# Patient Record
Sex: Female | Born: 1970 | Race: White | Hispanic: No | Marital: Married | Smoking: Never smoker
Health system: Southern US, Community
[De-identification: ages and names within clinical notes are randomized; demographics above are authoritative.]

## PROBLEM LIST (undated history)

## (undated) DIAGNOSIS — D259 Leiomyoma of uterus, unspecified: Secondary | ICD-10-CM

## (undated) DIAGNOSIS — Z87820 Personal history of traumatic brain injury: Secondary | ICD-10-CM

## (undated) DIAGNOSIS — L309 Dermatitis, unspecified: Secondary | ICD-10-CM

## (undated) DIAGNOSIS — D649 Anemia, unspecified: Secondary | ICD-10-CM

## (undated) DIAGNOSIS — R569 Unspecified convulsions: Secondary | ICD-10-CM

## (undated) DIAGNOSIS — F329 Major depressive disorder, single episode, unspecified: Secondary | ICD-10-CM

## (undated) DIAGNOSIS — F32A Depression, unspecified: Secondary | ICD-10-CM

## (undated) HISTORY — DX: Dermatitis, unspecified: L30.9

## (undated) HISTORY — DX: Unspecified convulsions: R56.9

## (undated) HISTORY — DX: Personal history of traumatic brain injury: Z87.820

## (undated) HISTORY — DX: Leiomyoma of uterus, unspecified: D25.9

## (undated) HISTORY — DX: Anemia, unspecified: D64.9

---
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
## 1995-02-14 DIAGNOSIS — Z87820 Personal history of traumatic brain injury: Secondary | ICD-10-CM

## 1995-02-14 HISTORY — DX: Personal history of traumatic brain injury: Z87.820

## 2005-01-09 ENCOUNTER — Encounter: Admission: RE | Admit: 2005-01-09 | Discharge: 2005-01-09 | Payer: Self-pay | Admitting: Family Medicine

## 2005-05-26 ENCOUNTER — Encounter: Admission: RE | Admit: 2005-05-26 | Discharge: 2005-05-26 | Payer: Self-pay | Admitting: Family Medicine

## 2006-05-18 ENCOUNTER — Ambulatory Visit: Payer: Self-pay | Admitting: Family Medicine

## 2007-02-14 HISTORY — PX: UTERINE ARTERY EMBOLIZATION: SHX2629

## 2007-11-15 ENCOUNTER — Ambulatory Visit: Payer: Self-pay | Admitting: Occupational Medicine

## 2007-11-30 ENCOUNTER — Emergency Department: Payer: Self-pay | Admitting: Emergency Medicine

## 2007-12-18 ENCOUNTER — Encounter: Payer: Self-pay | Admitting: Cardiovascular Disease

## 2007-12-18 ENCOUNTER — Ambulatory Visit (HOSPITAL_COMMUNITY): Admission: RE | Admit: 2007-12-18 | Discharge: 2007-12-18 | Payer: Self-pay | Admitting: Cardiovascular Disease

## 2007-12-28 ENCOUNTER — Ambulatory Visit (HOSPITAL_BASED_OUTPATIENT_CLINIC_OR_DEPARTMENT_OTHER): Admission: RE | Admit: 2007-12-28 | Discharge: 2007-12-28 | Payer: Self-pay | Admitting: Internal Medicine

## 2008-01-11 ENCOUNTER — Ambulatory Visit: Payer: Self-pay | Admitting: Internal Medicine

## 2008-01-20 ENCOUNTER — Ambulatory Visit (HOSPITAL_BASED_OUTPATIENT_CLINIC_OR_DEPARTMENT_OTHER): Admission: RE | Admit: 2008-01-20 | Discharge: 2008-01-20 | Payer: Self-pay | Admitting: Internal Medicine

## 2008-01-21 ENCOUNTER — Ambulatory Visit (HOSPITAL_COMMUNITY): Admission: RE | Admit: 2008-01-21 | Discharge: 2008-01-21 | Payer: Self-pay | Admitting: Cardiovascular Disease

## 2008-01-22 ENCOUNTER — Ambulatory Visit (HOSPITAL_COMMUNITY): Admission: RE | Admit: 2008-01-22 | Discharge: 2008-01-22 | Payer: Self-pay | Admitting: Cardiovascular Disease

## 2008-01-25 ENCOUNTER — Ambulatory Visit: Payer: Self-pay | Admitting: Internal Medicine

## 2008-04-08 ENCOUNTER — Encounter: Admission: RE | Admit: 2008-04-08 | Discharge: 2008-04-08 | Payer: Self-pay | Admitting: Internal Medicine

## 2008-04-30 ENCOUNTER — Emergency Department (HOSPITAL_COMMUNITY): Admission: EM | Admit: 2008-04-30 | Discharge: 2008-04-30 | Payer: Self-pay | Admitting: Family Medicine

## 2008-05-25 ENCOUNTER — Ambulatory Visit (HOSPITAL_COMMUNITY): Admission: RE | Admit: 2008-05-25 | Discharge: 2008-05-25 | Payer: Self-pay | Admitting: Internal Medicine

## 2008-06-16 ENCOUNTER — Ambulatory Visit (HOSPITAL_COMMUNITY): Admission: RE | Admit: 2008-06-16 | Discharge: 2008-06-16 | Payer: Self-pay | Admitting: Internal Medicine

## 2008-07-07 ENCOUNTER — Encounter: Admission: RE | Admit: 2008-07-07 | Discharge: 2008-07-07 | Payer: Self-pay | Admitting: Obstetrics and Gynecology

## 2008-07-14 ENCOUNTER — Ambulatory Visit (HOSPITAL_COMMUNITY): Admission: RE | Admit: 2008-07-14 | Discharge: 2008-07-14 | Payer: Self-pay | Admitting: Interventional Radiology

## 2008-09-01 ENCOUNTER — Ambulatory Visit (HOSPITAL_COMMUNITY): Admission: RE | Admit: 2008-09-01 | Discharge: 2008-09-02 | Payer: Self-pay | Admitting: Interventional Radiology

## 2008-09-15 ENCOUNTER — Encounter: Admission: RE | Admit: 2008-09-15 | Discharge: 2008-09-15 | Payer: Self-pay | Admitting: Interventional Radiology

## 2009-04-20 ENCOUNTER — Encounter: Admission: RE | Admit: 2009-04-20 | Discharge: 2009-04-20 | Payer: Self-pay | Admitting: Interventional Radiology

## 2009-04-20 ENCOUNTER — Ambulatory Visit (HOSPITAL_COMMUNITY): Admission: RE | Admit: 2009-04-20 | Discharge: 2009-04-20 | Payer: Self-pay | Admitting: Interventional Radiology

## 2009-06-21 ENCOUNTER — Encounter: Admission: RE | Admit: 2009-06-21 | Discharge: 2009-06-21 | Payer: Self-pay | Admitting: Internal Medicine

## 2009-07-26 ENCOUNTER — Ambulatory Visit (HOSPITAL_COMMUNITY): Admission: RE | Admit: 2009-07-26 | Discharge: 2009-07-26 | Payer: Self-pay | Admitting: Interventional Radiology

## 2009-12-08 ENCOUNTER — Emergency Department (HOSPITAL_COMMUNITY): Admission: EM | Admit: 2009-12-08 | Discharge: 2009-12-08 | Payer: Self-pay | Admitting: Family Medicine

## 2010-01-16 ENCOUNTER — Emergency Department (HOSPITAL_COMMUNITY)
Admission: EM | Admit: 2010-01-16 | Discharge: 2010-01-16 | Payer: Self-pay | Source: Home / Self Care | Admitting: Emergency Medicine

## 2010-03-06 ENCOUNTER — Encounter: Payer: Self-pay | Admitting: Internal Medicine

## 2010-03-06 ENCOUNTER — Encounter: Payer: Self-pay | Admitting: Obstetrics and Gynecology

## 2010-03-06 ENCOUNTER — Encounter: Payer: Self-pay | Admitting: Cardiovascular Disease

## 2010-04-14 ENCOUNTER — Inpatient Hospital Stay: Payer: Self-pay | Admitting: Surgery

## 2010-05-22 LAB — CBC
HCT: 38.6 % (ref 36.0–46.0)
Hemoglobin: 13.4 g/dL (ref 12.0–15.0)
MCHC: 34.6 g/dL (ref 30.0–36.0)
MCV: 90.2 fL (ref 78.0–100.0)
Platelets: 261 10*3/uL (ref 150–400)
RBC: 4.28 MIL/uL (ref 3.87–5.11)
RDW: 12.8 % (ref 11.5–15.5)
WBC: 6.9 10*3/uL (ref 4.0–10.5)

## 2010-05-22 LAB — HCG, SERUM, QUALITATIVE: Preg, Serum: NEGATIVE

## 2010-05-22 LAB — CREATININE, SERUM
Creatinine, Ser: 0.59 mg/dL (ref 0.4–1.2)
GFR calc Af Amer: >60 mL/min (ref >60)
GFR calc non Af Amer: >60 mL/min (ref >60)

## 2010-06-28 NOTE — Procedures (Signed)
NAME:  Valerie Underwood, Valerie Underwood                   ACCOUNT NO.:  [id number]   MEDICAL RECORD NO.:  [id number]          PATIENT TYPE:  OUT   LOCATION:  SLEEP CENTER                 FACILITY:  [facility]   PHYSICIAN:  Clinton D. Maple Hudson, MD, FCCP, FACPDATE OF BIRTH:  08/05/1968   DATE OF STUDY:  01/20/2008                            NOCTURNAL POLYSOMNOGRAM   REFERRING PHYSICIAN:  Beverely Risen   REFERRING PHYSICIAN:  Beverely Risen, MD   INDICATION FOR STUDY:  Hypersomnia with sleep apnea.   EPWORTH SLEEPINESS SCORE:  10/24.  BMI 44.  Weight 280 pounds.  Height  67 inches.  Neck 16.5 inches.   HOME MEDICATIONS:  Charted and reviewed.  A baseline diagnostic NPSG on  December 28, 2007, recorded an AHI of 5.1 per hour.  CPAP titration is  requested.   SLEEP ARCHITECTURE:  Total sleep time 270 minutes with sleep efficiency  76.1%.  Stage I was 2.2%.  Stage II 67.7%.  Stage III 3.9%.  REM 26.2%  of total sleep time.  Sleep latency 42 minutes.  REM latency 80.5  minutes.  Awake after sleep onset 42.5 minutes.  Arousal index 19.3.  No  bedtime medication was taken.   RESPIRATORY DATA:  CPAP titration protocol.  CPAP was titrated to 10  CWP, AHI 1.2 per hour.  She chose a small ResMed Mirage Quattro mask  with heated humidifier.  She made the comment that she would need a more  comfortable mask for longterm use.   OXYGEN DATA:  Snoring was completely prevented by CPAP with mean oxygen  saturation on CPAP holding 94.7% on room air.   CARDIAC DATA:  Normal sinus rhythm.   MOVEMENT/PARASOMNIA:  No significant limb movement disturbance.  Bathroom x1.   IMPRESSION/RECOMMENDATION:  1. Successful CPAP titration to 10 centimeters of water pressure,      apnea-hypopnea index 1.2 per hour.  She chose a small ResMed Mirage      Quattro full face mask with heated humidifier.  2. Baseline diagnostic nocturnal polysomnogram on December 28, 2007,      had recorded apnea-hypopnea index of 5.1 per hour.      Clinton D.  Maple Hudson, MD, [facility], FACP  Diplomate, Biomedical engineer of Sleep Medicine  Electronically Signed     CDY/MEDQ  D:  01/25/2008 13:55:10  T:  01/25/2008 20:21:06  Job:  [id number]

## 2010-06-28 NOTE — Procedures (Signed)
NAME:  Valerie Underwood, Valerie Underwood                   ACCOUNT NO.:  [id number]   MEDICAL RECORD NO.:  [id number]          PATIENT TYPE:  OUT   LOCATION:  SLEEP CENTER                 FACILITY:  [facility]   PHYSICIAN:  Clinton D. Maple Hudson, MD, FCCP, FACPDATE OF BIRTH:  05[birth year]07-68   DATE OF STUDY:  12/28/2007                            NOCTURNAL POLYSOMNOGRAM   REFERRING PHYSICIAN:   INDICATION FOR STUDY:  Hypersomnia with sleep apnea.   EPWORTH SLEEPINESS SCORE:  10/24.   BMI 46.3.  Weight 287 pounds.  Height 66 inches.  Neck 16.5 inches.   MEDICATIONS:  Home medications are charted and reviewed.   SLEEP ARCHITECTURE:  Total sleep time 327 minutes with sleep efficiency  74.7%.  Stage I was 4.7%.  Stage II 78.8%.  Stage III 1.5%.  REM 15% of  total sleep time.  Sleep latency 24 minutes.  REM latency 122 minutes.  Wake after sleep onset 8 minutes.  Arousal index 13.2.  No bedtime  medication was taken.   RESPIRATORY DATA:  Apnea-hypopnea index (AHI) 5.1 per hour.  Respiratory  disturbance index (RDI) 6.4 per hour with RERA count 7, index 1.3.  A  total of 28 events were scored, all hypopneas.  Events were more common  in supine as expected.  REM AHI 19.6.   OXYGEN DATA:  Moderate-to-loud snoring with oxygen desaturation to a  nadir of 86%.  Mean oxygen saturation through the study was 93.7% on  room air.   CARDIAC DATA:  Normal sinus rhythm.   MOVEMENT-PARASOMNIA:  No significant movement disturbance.  No bathroom  trips.   IMPRESSIONS-RECOMMENDATIONS:  1. Minimal obstructive sleep apnea/hypopnea syndrome, AHI 5.1 per hour      (normal range of 0-5 per hour).  All events were hypopneas,      predominantly while sleeping supine.  Moderate-to-loud snoring with      oxygen desaturation to a nadir of 86%.  2. Scores from this range are not usually addressed with CPAP.  Weight      loss and sleeping position of flatter back may be encouraged if      appropriate.      Clinton D. Maple Hudson, MD, FCCP,  FACP  Diplomate, Biomedical engineer of Sleep Medicine  Electronically Signed     CDY/MEDQ  D:  01/04/2008 13:50:04  T:  01/05/2008 00:57:07  Job:  [id number]

## 2010-07-05 ENCOUNTER — Other Ambulatory Visit: Payer: Self-pay | Admitting: Internal Medicine

## 2010-07-05 DIAGNOSIS — Z1231 Encounter for screening mammogram for malignant neoplasm of breast: Secondary | ICD-10-CM

## 2010-07-13 ENCOUNTER — Ambulatory Visit: Payer: Self-pay | Admitting: Internal Medicine

## 2010-07-13 ENCOUNTER — Ambulatory Visit: Payer: Self-pay

## 2010-08-05 ENCOUNTER — Ambulatory Visit
Admission: RE | Admit: 2010-08-05 | Discharge: 2010-08-05 | Disposition: A | Discharge status: Home / Self Care | Payer: Commercial Managed Care - PPO | Source: Ambulatory Visit | Attending: Internal Medicine | Admitting: Internal Medicine

## 2010-08-05 DIAGNOSIS — Z1231 Encounter for screening mammogram for malignant neoplasm of breast: Secondary | ICD-10-CM

## 2010-11-08 ENCOUNTER — Ambulatory Visit: Payer: Commercial Managed Care - PPO | Admitting: Family Medicine

## 2010-11-11 ENCOUNTER — Encounter: Payer: Self-pay | Admitting: Family Medicine

## 2010-11-11 ENCOUNTER — Ambulatory Visit (INDEPENDENT_AMBULATORY_CARE_PROVIDER_SITE_OTHER): Payer: Commercial Managed Care - PPO | Admitting: Family Medicine

## 2010-11-11 VITALS — BP 134/93 | HR 69 | Temp 98.4°F | Ht 68.0 in | Wt 275.0 lb

## 2010-11-11 DIAGNOSIS — M79672 Pain in left foot: Secondary | ICD-10-CM | POA: Insufficient documentation

## 2010-11-11 DIAGNOSIS — M79609 Pain in unspecified limb: Secondary | ICD-10-CM

## 2010-11-11 NOTE — Assessment & Plan Note (Signed)
2/2 metatarsalgia and ? Mortons neuroma in past.  X-rays show no evidence of DJD at MTP either.  While she has had metatarsal pads before, when I placed these she noted they were not in the same location and did not feel as comfortable as where I had placed them.  Advised her to try these with sports insoles.  Icing, tylenol/motrin as needed for pain, relative rest.  If does well with these, advised her to order from [employer] in bulk.  Custom orthotics from [place] are also an option.

## 2010-11-11 NOTE — Progress Notes (Signed)
  Subjective:    Patient ID: Valerie Underwood, female    DOB: 12/15/1970, 40 y.o.   MRN: [id number]  PCP: Dr. Welton Flakes  HPI 40 yo F here for left foot pain.  Patient reports having problems with feet for approximately 20 years.  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 Has had custom orthotics made which help mildly. Has had several cortisone injections between 3rd and 4th MTs which help initially but always wear off. Podiatrist earlier this year did an aspiration and injection of 4th MTP that helped mildly as well. Most recent visit he recommended patient have surgical debridement and discussed possible fusion. Patient would like to continue with conservative care if possible. Has tried metatarsal pads but they did not feel comfortable.  History reviewed. No pertinent past medical history.  No current outpatient prescriptions on file prior to visit.    History reviewed. No pertinent past surgical history.  No Known Allergies  History   Social History  . Marital Status: Single    Spouse Name: N/A    Number of Children: N/A  . Years of Education: N/A   Occupational History  . Not on file.   Social History Main Topics  . Smoking status: Never Smoker   . Smokeless tobacco: Not on file  . Alcohol Use: Not on file  . Drug Use: Not on file  . Sexually Active: Not on file   Other Topics Concern  . Not on file   Social History Narrative  . No narrative on file    Family History  Problem Relation Age of Onset  . Adopted: Yes    BP 134/93  Pulse 69  Temp(Src) 98.4 F (36.9 C) (Oral)  Ht 5\' 8"  (1.727 m)  Wt 275 lb (124.739 kg)  BMI 41.81 kg/m2  Review of Systems See HPI above.    Objective:   Physical Exam Gen: NAD L foot: Mod collapse of transverse arch but no callus formation.  L worse than R foot. TTP 4th MT head, less tenderness 3rd MT head and between 3rd and 4th MT heads. No palpable neuroma on exam. FROM ankle, digits  without limitation at MTP joints. NVI distally    Assessment & Plan:  1. Left foot pain - 2/2 metatarsalgia and ? Mortons neuroma in past.  X-rays show no evidence of DJD at MTP either.  While she has had metatarsal pads before, when I placed these she noted they were not in the same location and did not feel as comfortable as where I had placed them.  Advised her to try these with sports insoles.  Icing, tylenol/motrin as needed for pain, relative rest.  If does well with these, advised her to order from [employer] in bulk.  Custom orthotics from [place] are also an option.

## 2010-11-11 NOTE — Patient Instructions (Signed)
Conservative treatment for this long term includes orthotics + metatarsal pads. You can ice, take anti-inflammatories too but these are only temporary measures. There are surgical procedures to remove the nerve and neuroma but most people do not need these.

## 2011-01-17 ENCOUNTER — Encounter: Payer: Self-pay | Admitting: Internal Medicine

## 2011-01-17 ENCOUNTER — Ambulatory Visit (INDEPENDENT_AMBULATORY_CARE_PROVIDER_SITE_OTHER): Payer: 59 | Admitting: Internal Medicine

## 2011-01-17 VITALS — BP 128/86 | HR 88 | Temp 98.2°F | Ht 66.5 in | Wt 284.0 lb

## 2011-01-17 DIAGNOSIS — F329 Major depressive disorder, single episode, unspecified: Secondary | ICD-10-CM

## 2011-01-17 DIAGNOSIS — R569 Unspecified convulsions: Secondary | ICD-10-CM | POA: Insufficient documentation

## 2011-01-17 DIAGNOSIS — D259 Leiomyoma of uterus, unspecified: Secondary | ICD-10-CM | POA: Insufficient documentation

## 2011-01-17 DIAGNOSIS — Z780 Asymptomatic menopausal state: Secondary | ICD-10-CM

## 2011-01-17 DIAGNOSIS — Z87820 Personal history of traumatic brain injury: Secondary | ICD-10-CM | POA: Insufficient documentation

## 2011-01-17 DIAGNOSIS — N951 Menopausal and female climacteric states: Secondary | ICD-10-CM

## 2011-01-17 DIAGNOSIS — D649 Anemia, unspecified: Secondary | ICD-10-CM | POA: Insufficient documentation

## 2011-01-17 DIAGNOSIS — F32A Depression, unspecified: Secondary | ICD-10-CM | POA: Insufficient documentation

## 2011-01-17 MED ORDER — CLONIDINE HCL 0.1 MG PO TABS: ORAL_TABLET | ORAL | Status: AC

## 2011-01-17 NOTE — Patient Instructions (Addendum)
OK to take clonidine o.1 mg at night.  Get up slowly from sleep for the first week  See me for CPE  Labs will be mailed to you

## 2011-01-17 NOTE — Progress Notes (Signed)
Addended by: Raechel Chute D on: 01/17/2011 10:35 AM   Modules accepted: Orders

## 2011-01-17 NOTE — Progress Notes (Signed)
Subjective:    Patient ID: Valerie Underwood, female    DOB: 09/15/1970, 40 y.o.   MRN: [id number]  HPI  New pt here for first visit.  Former pt of Dr. Liz Beach. And [facility].  Had seen remotely a  Neurologist in [facility] for traumatic brain injury and seizure.  PMH of menorrhagia with anemia S?P [place],  Depression since 1989,  TBI with resultant seizures but has been off anti seizure meds since 2002.    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  Having insomnia and mood irritability.  She denies worsening depression and Zoloft has kept her well controlled.  She still is having monthly cycles.  She is not aware of FH as she was in foster care since the age of 1.  NO personal history of DVT, PE breast or GYN cancers.    No Known Allergies Past Medical History  Diagnosis Date  . Uterine fibroid     history  . History of traumatic brain injury 1997  . Anemia   . Seizures     hx- after TBI, last sz 2001   Past Surgical History  Procedure Date  . Uterine artery embolization 2009  . Dilation and curettage of uterus 1988   History   Social History  . Marital Status: Single    Spouse Name: N/A    Number of Children: N/A  . Years of Education: N/A   Occupational History  . Not on file.   Social History Main Topics  . Smoking status: Never Smoker   . Smokeless tobacco: Never Used  . Alcohol Use: 2.4 oz/week    4 Glasses of wine per week  . Drug Use: No  . Sexually Active: Yes    Birth Control/ Protection: None   Other Topics Concern  . Not on file   Social History Narrative  . No narrative on file   Family History  Problem Relation Age of Onset  . Adopted: Yes   Patient Active Problem List  Diagnoses  . Left foot pain  . Anemia  . Uterine fibroid  . History of traumatic brain injury  . Seizures  . Morbid obesity  . Depression   No current outpatient prescriptions on file prior to  visit.           Review of Systems See HPI    Objective:   Physical Exam  Physical Exam  Nursing note and vitals reviewed.  Constitutional: She is oriented to person, place, and time. She appears well-developed and well-nourished.  HENT:  Head: Normocephalic and atraumatic.  Cardiovascular: Normal rate and regular rhythm. Exam reveals no gallop and no friction rub.  No murmur heard.  Pulmonary/Chest: Breath sounds normal. She has no wheezes. She has no rales.  Neurological: She is alert and oriented to person, place, and time.  Skin: Skin is warm and dry.  Psychiatric: She has a normal mood and affect. Her behavior is normal.        Assessment & Plan:  1)  Menopausal hot flushes, insomnia  Will check TSH FSH and LH  .  30 min counseling of HT and risk benefit.  Pt given informational sheet.   She wishes to try Clonidine at nigth 0.1 mg  Counsleed to arise slowly when getting up in morning for first week  2)  Depression  Well controlled on Zoloft 3)  MOrbid obesity  4)  TBI  Remote seizures off meds now 5)  Anemia  Will check CBC

## 2011-01-18 ENCOUNTER — Encounter: Payer: Self-pay | Admitting: Internal Medicine

## 2011-01-18 ENCOUNTER — Encounter: Payer: Self-pay | Admitting: Emergency Medicine

## 2011-01-18 DIAGNOSIS — E781 Pure hyperglyceridemia: Secondary | ICD-10-CM | POA: Insufficient documentation

## 2011-01-18 LAB — COMPREHENSIVE METABOLIC PANEL
AST: 19 U/L (ref 0–37)
Albumin: 4.7 g/dL (ref 3.5–5.2)
Alkaline Phosphatase: 53 U/L (ref 39–117)
BUN: 19 mg/dL (ref 6–23)
Creat: 0.67 mg/dL (ref 0.50–1.10)
Potassium: 4.4 mEq/L (ref 3.5–5.3)
Total Bilirubin: 0.4 mg/dL (ref 0.3–1.2)

## 2011-01-18 LAB — LIPID PANEL
Cholesterol: 212 mg/dL — ABNORMAL HIGH (ref 0–200)
HDL: 40 mg/dL (ref >39)
Total CHOL/HDL Ratio: 5.3 Ratio
VLDL: 73 mg/dL — ABNORMAL HIGH (ref 0–40)

## 2011-02-15 ENCOUNTER — Encounter: Payer: 59 | Admitting: Internal Medicine

## 2011-05-23 ENCOUNTER — Ambulatory Visit (INDEPENDENT_AMBULATORY_CARE_PROVIDER_SITE_OTHER): Payer: 59 | Admitting: Family Medicine

## 2011-05-23 ENCOUNTER — Ambulatory Visit (INDEPENDENT_AMBULATORY_CARE_PROVIDER_SITE_OTHER): Payer: 59 | Admitting: Internal Medicine

## 2011-05-23 ENCOUNTER — Encounter: Payer: Self-pay | Admitting: Family Medicine

## 2011-05-23 ENCOUNTER — Encounter: Payer: Self-pay | Admitting: Internal Medicine

## 2011-05-23 VITALS — BP 123/86 | HR 85 | Temp 98.3°F | Ht 67.0 in | Wt 250.0 lb

## 2011-05-23 VITALS — BP 128/80 | HR 64 | Temp 98.6°F | Resp 12 | Ht 66.5 in | Wt 269.1 lb

## 2011-05-23 DIAGNOSIS — Z124 Encounter for screening for malignant neoplasm of cervix: Secondary | ICD-10-CM

## 2011-05-23 DIAGNOSIS — M7750 Other enthesopathy of unspecified foot: Secondary | ICD-10-CM

## 2011-05-23 DIAGNOSIS — Z780 Asymptomatic menopausal state: Secondary | ICD-10-CM

## 2011-05-23 DIAGNOSIS — Z1151 Encounter for screening for human papillomavirus (HPV): Secondary | ICD-10-CM

## 2011-05-23 DIAGNOSIS — M79672 Pain in left foot: Secondary | ICD-10-CM

## 2011-05-23 DIAGNOSIS — N951 Menopausal and female climacteric states: Secondary | ICD-10-CM

## 2011-05-23 DIAGNOSIS — R269 Unspecified abnormalities of gait and mobility: Secondary | ICD-10-CM

## 2011-05-23 DIAGNOSIS — Z01419 Encounter for gynecological examination (general) (routine) without abnormal findings: Secondary | ICD-10-CM

## 2011-05-23 DIAGNOSIS — M7740 Metatarsalgia, unspecified foot: Secondary | ICD-10-CM

## 2011-05-23 DIAGNOSIS — M79609 Pain in unspecified limb: Secondary | ICD-10-CM

## 2011-05-23 LAB — POCT URINALYSIS DIPSTICK
Bilirubin, UA: NEGATIVE
Blood, UA: NEGATIVE
Glucose, UA: NEGATIVE
Ketones, UA: NEGATIVE
Spec Grav, UA: <=1.005

## 2011-05-23 NOTE — Progress Notes (Signed)
Subjective:    Patient ID: Valerie Underwood, female    DOB: [birth year], 41 y.o.   MRN: [id number]  PCP: Dr. Welton Flakes  HPI  41 yo F here for f/u left foot pain.  11/11/10: Patient reports having problems with feet for approximately 20 years.  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 Has had custom orthotics made which help mildly. Has had several cortisone injections between 3rd and 4th MTs which help initially but always wear off. Podiatrist earlier this year did an aspiration and injection of 4th MTP that helped mildly as well. Most recent visit he recommended patient have surgical debridement and discussed possible fusion. Patient would like to continue with conservative care if possible. Has tried metatarsal pads but they did not feel comfortable.  05/23/11: Patient reports she did well with sports insoles and MT pads. Feels the sports insoles are now worn out and MT pads were sliding on her. Has a few months excellent relief with this though. Would like to go ahead with custom orthotics.  Past Medical History  Diagnosis Date  . Uterine fibroid     history  . History of traumatic brain injury 1997  . Anemia   . Seizures     hx- after TBI, last sz 2001    Current Outpatient Prescriptions on File Prior to Visit  Medication Sig Dispense Refill  . Multiple Vitamin (MULTIVITAMIN) tablet Take 1 tablet by mouth daily.        . sertraline (ZOLOFT) 100 MG tablet Take 100 mg by mouth daily.          Past Surgical History  Procedure Date  . Uterine artery embolization 2009  . Dilation and curettage of uterus 1988    No Known Allergies  History   Social History  . Marital Status: Single    Spouse Name: N/A    Number of Children: N/A  . Years of Education: N/A   Occupational History  . Not on file.   Social History Main Topics  . Smoking status: Never Smoker   . Smokeless tobacco: Never Used  . Alcohol Use: 2.4 oz/week    4 Glasses  of wine per week  . Drug Use: No  . Sexually Active: Yes    Birth Control/ Protection: None   Other Topics Concern  . Not on file   Social History Narrative  . No narrative on file    Family History  Problem Relation Age of Onset  . Adopted: Yes    BP 123/86  Pulse 85  Temp(Src) 98.3 F (36.8 C) (Oral)  Ht 5\' 7"  (1.702 m)  Wt 250 lb (113.399 kg)  BMI 39.16 kg/m2  LMP 05/16/2011  Review of Systems  See HPI above.    Objective:   Physical Exam  Gen: NAD L foot: Mod collapse of transverse arch but no callus formation.  L worse than R foot. No hallux valgus or rigidus. Mild TTP 3rd and 4th MT heads. No palpable neuroma on exam. FROM ankle, digits without limitation at MTP joints. NVI distally  R foot: Mod transverse arch collapse, no callus.  No hallux valgus or rigidus. No TTP.    Assessment & Plan:  1. Left foot pain - 2/2 metatarsalgia and ? Mortons neuroma in past.  Excellent results with sports insoles and metatarsal pads.  Will move forward with custom orthotics today.  Advised to first try these without the MT pads for 1-2 weeks - if  not improving advised to come back and will just add the MT pads.    Patient was fitted for a : standard, cushioned, semi-rigid orthotic. The orthotic was heated and afterward the patient stood on the orthotic blank positioned on the orthotic stand. The patient was positioned in subtalar neutral position and 10 degrees of ankle dorsiflexion in a weight bearing stance. After completion of molding, a stable base was applied to the orthotic blank. The blank was ground to a stable position for weight bearing. Size: 10 dark blue swirl Base: blue med density eva Posting: 1st ray posts bilaterally for stability Additional orthotic padding: none Total prep time: 40 minutes

## 2011-05-23 NOTE — Progress Notes (Signed)
Subjective:    Patient ID: Valerie Underwood, female    DOB: Oct 18, 1970, 41 y.o.   MRN: [id number]  HPI Valerie Underwood is here for comprehensive eval.  Overall doing well  .  She did not start Clonidine as her sweats are only 1-2 times per month in the peri-menstrual time frame.  Of note she has been on Zoloft since the 1980's.   She reports one episode of vaginal spotting just this month.  She had vaginal spotting yesterday.  She is S/P [place] for enlarged fibroids and menses have been regular.   She reports she has a long history of enlarged uterus with very large fibroids   I reviewed labs with her.    No seizure activity.  No Known Allergies Past Medical History  Diagnosis Date  . Uterine fibroid     history  . History of traumatic brain injury 1997  . Anemia   . Seizures     hx- after TBI, last sz 2001   Past Surgical History  Procedure Date  . Uterine artery embolization 2009  . Dilation and curettage of uterus 1988   History   Social History  . Marital Status: Single    Spouse Name: N/A    Number of Children: N/A  . Years of Education: N/A   Occupational History  . Not on file.   Social History Main Topics  . Smoking status: Never Smoker   . Smokeless tobacco: Never Used  . Alcohol Use: 2.4 oz/week    4 Glasses of wine per week  . Drug Use: No  . Sexually Active: Yes    Birth Control/ Protection: None   Other Topics Concern  . Not on file   Social History Narrative  . No narrative on file   Family History  Problem Relation Age of Onset  . Adopted: Yes   Patient Active Problem List  Diagnoses  . Left foot pain  . Anemia  . Uterine fibroid  . History of traumatic brain injury  . Seizures  . Morbid obesity  . Depression  . Hypertriglyceridemia   Current Outpatient Prescriptions on File Prior to Visit  Medication Sig Dispense Refill  . Multiple Vitamin (MULTIVITAMIN) tablet Take 1 tablet by mouth daily.        . sertraline (ZOLOFT) 100 MG tablet Take 100 mg by  mouth daily.             Review of Systems  Constitutional: Negative.   HENT: Negative.   Eyes: Negative.   Respiratory: Negative.   Cardiovascular: Negative.   Gastrointestinal: Negative.   Genitourinary: Negative.   Musculoskeletal: Negative.   Skin: Negative.   Neurological: Negative.   Hematological: Negative.        Objective:   Physical Exam  Physical Exam  Vital signs and nursing note reviewed  Constitutional: She is oriented to person, place, and time. She appears well-developed and well-nourished. She is cooperative.  HENT:  Head: Normocephalic and atraumatic.  Right Ear: Tympanic membrane normal.  Left Ear: Tympanic membrane normal.  Nose: Nose normal.  Mouth/Throat: Oropharynx is clear and moist and mucous membranes are normal. No oropharyngeal exudate or posterior oropharyngeal erythema.  Eyes: Conjunctivae and EOM are normal. Pupils are equal, round, and reactive to light.  Neck: Neck supple. No JVD present. Carotid bruit is not present. No mass and no thyromegaly present.  Cardiovascular: Regular rhythm, normal heart sounds, intact distal pulses and normal pulses.  Exam reveals no gallop and no friction rub.  No murmur heard. Pulses:      Dorsalis pedis pulses are 2+ on the right side, and 2+ on the left side.  Pulmonary/Chest: Breath sounds normal. She has no wheezes. She has no rhonchi. She has no rales. Right breast exhibits no mass, no nipple discharge and no skin change. Left breast exhibits no mass, no nipple discharge and no skin change.  Abdominal: Soft. Bowel sounds are normal. She exhibits no distension and no mass. There is no hepatosplenomegaly. There is no tenderness. There is no CVA tenderness.  Genitourinary: Rectum normal, vagina normal. Uterus is enlarged midway to umbilicus. Rectal exam shows no mass. Guaiac negative stool. No labial fusion. There is no lesion on the right labia. There is no lesion on the left labia. Cervix exhibits no motion  tenderness. Right adnexum displays no mass, no tenderness and no fullness. Left adnexum displays no mass, no tenderness and no fullness. No erythema around the vagina.  Musculoskeletal:       No active synovitis to any joint.    Lymphadenopathy:       Right cervical: No superficial cervical adenopathy present.      Left cervical: No superficial cervical adenopathy present.       Right axillary: No pectoral and no lateral adenopathy present.       Left axillary: No pectoral and no lateral adenopathy present.      Right: No inguinal adenopathy present.       Left: No inguinal adenopathy present.  Neurological: She is alert and oriented to person, place, and time. She has normal strength and normal reflexes. No cranial nerve deficit or sensory deficit. She displays a negative Romberg sign. Coordination and gait normal.  Skin: Skin is warm and dry. No abrasion, no bruising, no ecchymosis and no rash noted. No cyanosis. Nails show no clubbing. Few nevi on back and nevus on R breast near nipple.  Psychiatric: She has a normal mood and affect. Her speech is normal and behavior is normal.       Assessment & Plan:   1)  Health maintenance. See scanned HM sheet.  Check CBC and Vit D today.  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 2)  Vaginal spotting S/P [place]? Uterine fibroid.  I counseled pt if spotting continues longer that 3 months she is to see me in office and will need TVUS.  She voices understanding.  She does have history of large fibroids. 3)  Anemia by history  Will check today 4)  History of sezure and TBI no seizure activity 5)  Obesity:  Dash diet given  6)  Dpression Well controlled on Zoloft 7)  Hypertriglyceridemia  Mild Dash Diet     Assessment & Plan:

## 2011-05-23 NOTE — Patient Instructions (Signed)
Call office for appointment  if vaginal spotting continues longer than 3 months  Labs will be mailed to you

## 2011-05-24 ENCOUNTER — Encounter: Payer: Self-pay | Admitting: Emergency Medicine

## 2011-05-24 DIAGNOSIS — R269 Unspecified abnormalities of gait and mobility: Secondary | ICD-10-CM | POA: Insufficient documentation

## 2011-05-24 DIAGNOSIS — M7740 Metatarsalgia, unspecified foot: Secondary | ICD-10-CM | POA: Insufficient documentation

## 2011-05-24 LAB — CBC WITH DIFFERENTIAL/PLATELET
Eosinophils Absolute: 0.1 10*3/uL (ref 0.0–0.7)
Eosinophils Relative: 1 % (ref 0–5)
HCT: 41.8 % (ref 36.0–46.0)
Hemoglobin: 13.6 g/dL (ref 12.0–15.0)
Lymphocytes Relative: 31 % (ref 12–46)
Lymphs Abs: 2.3 10*3/uL (ref 0.7–4.0)
MCH: 29.4 pg (ref 26.0–34.0)
MCV: 90.3 fL (ref 78.0–100.0)
Monocytes Relative: 6 % (ref 3–12)
RBC: 4.63 MIL/uL (ref 3.87–5.11)
WBC: 7.3 10*3/uL (ref 4.0–10.5)

## 2011-05-24 NOTE — Assessment & Plan Note (Signed)
2/2 metatarsalgia and ? Mortons neuroma in past.  Excellent results with sports insoles and metatarsal pads.  Will move forward with custom orthotics today.  Advised to first try these without the MT pads for 1-2 weeks - if not improving advised to come back and will just add the MT pads.    Patient was fitted for a : standard, cushioned, semi-rigid orthotic. The orthotic was heated and afterward the patient stood on the orthotic blank positioned on the orthotic stand. The patient was positioned in subtalar neutral position and 10 degrees of ankle dorsiflexion in a weight bearing stance. After completion of molding, a stable base was applied to the orthotic blank. The blank was ground to a stable position for weight bearing. Size: 10 dark blue swirl Base: blue med density eva Posting: 1st ray posts bilaterally for stability Additional orthotic padding: none Total prep time: 40 minutes

## 2011-05-29 ENCOUNTER — Encounter: Payer: Self-pay | Admitting: *Deleted

## 2011-07-26 ENCOUNTER — Other Ambulatory Visit (HOSPITAL_BASED_OUTPATIENT_CLINIC_OR_DEPARTMENT_OTHER): Payer: Self-pay | Admitting: Internal Medicine

## 2011-07-26 DIAGNOSIS — Z139 Encounter for screening, unspecified: Secondary | ICD-10-CM

## 2011-08-09 ENCOUNTER — Ambulatory Visit (HOSPITAL_BASED_OUTPATIENT_CLINIC_OR_DEPARTMENT_OTHER)
Admission: RE | Admit: 2011-08-09 | Discharge: 2011-08-09 | Disposition: A | Discharge status: Home / Self Care | Payer: 59 | Source: Ambulatory Visit | Attending: Internal Medicine | Admitting: Internal Medicine

## 2011-08-09 DIAGNOSIS — Z1231 Encounter for screening mammogram for malignant neoplasm of breast: Secondary | ICD-10-CM | POA: Insufficient documentation

## 2011-08-09 DIAGNOSIS — Z139 Encounter for screening, unspecified: Secondary | ICD-10-CM

## 2012-01-30 ENCOUNTER — Ambulatory Visit: Payer: Self-pay

## 2012-01-30 ENCOUNTER — Other Ambulatory Visit: Payer: Self-pay | Admitting: Occupational Medicine

## 2012-01-30 DIAGNOSIS — M25569 Pain in unspecified knee: Secondary | ICD-10-CM

## 2012-02-19 ENCOUNTER — Encounter: Payer: Self-pay | Admitting: Emergency Medicine

## 2012-02-19 ENCOUNTER — Emergency Department
Admission: EM | Admit: 2012-02-19 | Discharge: 2012-02-19 | Disposition: A | Discharge status: Home / Self Care | Payer: 59 | Source: Home / Self Care | Attending: Family Medicine | Admitting: Family Medicine

## 2012-02-19 ENCOUNTER — Emergency Department (INDEPENDENT_AMBULATORY_CARE_PROVIDER_SITE_OTHER): Payer: 59

## 2012-02-19 DIAGNOSIS — R509 Fever, unspecified: Secondary | ICD-10-CM

## 2012-02-19 DIAGNOSIS — R0989 Other specified symptoms and signs involving the circulatory and respiratory systems: Secondary | ICD-10-CM

## 2012-02-19 DIAGNOSIS — R062 Wheezing: Secondary | ICD-10-CM

## 2012-02-19 DIAGNOSIS — J101 Influenza due to other identified influenza virus with other respiratory manifestations: Secondary | ICD-10-CM

## 2012-02-19 DIAGNOSIS — R05 Cough: Secondary | ICD-10-CM

## 2012-02-19 DIAGNOSIS — J189 Pneumonia, unspecified organism: Secondary | ICD-10-CM

## 2012-02-19 DIAGNOSIS — R0602 Shortness of breath: Secondary | ICD-10-CM

## 2012-02-19 DIAGNOSIS — J111 Influenza due to unidentified influenza virus with other respiratory manifestations: Secondary | ICD-10-CM

## 2012-02-19 LAB — POCT INFLUENZA A/B: Influenza B, POC: NEGATIVE

## 2012-02-19 MED ORDER — CEFTRIAXONE SODIUM 1 G IJ SOLR
1.0000 g | INTRAMUSCULAR | Status: DC
  Administered 2012-02-19: 1 g via INTRAMUSCULAR

## 2012-02-19 MED ORDER — OSELTAMIVIR PHOSPHATE 75 MG PO CAPS: 75.0000 mg | ORAL_CAPSULE | Freq: Two times a day (BID) | ORAL | Status: AC

## 2012-02-19 MED ORDER — GUAIFENESIN-CODEINE 100-10 MG/5ML PO SYRP: 5.0000 mL | ORAL_SOLUTION | Freq: Three times a day (TID) | ORAL | Status: DC | PRN

## 2012-02-19 MED ORDER — AZITHROMYCIN 250 MG PO TABS: ORAL_TABLET | ORAL | Status: DC

## 2012-02-19 MED ORDER — METHYLPREDNISOLONE ACETATE 80 MG/ML IJ SUSP
80.0000 mg | Freq: Once | INTRAMUSCULAR | Status: AC
  Administered 2012-02-19: 80 mg via INTRAMUSCULAR

## 2012-02-19 MED ORDER — ALBUTEROL SULFATE HFA 108 (90 BASE) MCG/ACT IN AERS: 2.0000 | INHALATION_SPRAY | RESPIRATORY_TRACT | Status: DC | PRN

## 2012-02-19 NOTE — ED Provider Notes (Signed)
History     CSN: [id number]  Arrival date & time 02/19/12  0912   First MD Initiated Contact with Patient 02/19/12 [phone]      Chief Complaint  Patient presents with  . Fever  . 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 Flu shot was 1 month ago. No prior hx/o asthma. Has had some exertional dyspnea over past 2 days. No CP.   Symptoms Nasal discharge: yes Fever: yes; tmax 104 Sore throat: no Cough: yes Wheezing: yes Ear pain: no GI symptoms: no Sick contacts: yes  Red Flags  Stiff neck: no Dyspnea: yes Rash: no Swallowing difficulty: no  Sinusitis Risk Factors Headache/face pain: no Double sickening: no tooth pain: no  Allergy Risk Factors Sneezing: no Itchy scratchy throat: no Seasonal symptoms: no  Flu Risk Factors Headache: yes muscle aches: yes severe fatigue: yes    Past Medical History  Diagnosis Date  . Uterine fibroid     history  . History of traumatic brain injury 1997  . Anemia   . Seizures     hx- after TBI, last sz 2001    Past Surgical History  Procedure Date  . Uterine artery embolization 2009  . Dilation and curettage of uterus 1988    Family History  Problem Relation Age of Onset  . Adopted: Yes    History  Substance Use Topics  . Smoking status: Never Smoker   . Smokeless tobacco: Never Used  . Alcohol Use: 2.4 oz/week    4 Glasses of wine per week    OB History    Grav Para Term Preterm Abortions TAB SAB Ect Mult Living   1    1  1          Review of Systems  All other systems reviewed and are negative.    Allergies  Review of patient's allergies indicates no known allergies.  Home Medications   Current Outpatient Rx  Name  Route  Sig  Dispense  Refill  . ONE-DAILY MULTI VITAMINS PO TABS   Oral   Take 1 tablet by mouth daily.           .  SERTRALINE HCL 100 MG PO TABS   Oral   Take 100 mg by mouth daily.             BP 115/77  Pulse 90  Temp 98.5 F (36.9 C)  Resp 18  SpO2 96%  LMP 01/30/2012  Physical Exam  Constitutional:       Obese, NAD   HENT:  Head: Normocephalic and atraumatic.  Right Ear: External ear normal.       +nasal erythema, rhinorrhea bilaterally, + post oropharyngeal erythema    Eyes: Conjunctivae normal are normal. Pupils are equal, round, and reactive to light.  Neck: Normal range of motion. Neck supple.  Cardiovascular: Normal rate and regular rhythm.   Pulmonary/Chest: Effort normal.       Mild rales and wheezes predominantly in R hemithorax    Abdominal: Soft.  Musculoskeletal: Normal range of motion.  Lymphadenopathy:    She has no cervical adenopathy.  Neurological: She is alert.  Skin: Skin is warm.    ED Course  Procedures (including critical care time)  Labs Reviewed - No data to display Dg Chest 2 View  02/19/2012  *RADIOLOGY REPORT*  Clinical Data: Cough, fever, shortness of breath.  Congestion.  CHEST - 2 VIEW  Comparison: None.  Findings: Mild peribronchial thickening.  Minimal left basilar density, atelectasis or infiltrate.  Right lung is clear.  No effusions.  Heart is normal size.  No acute bony abnormality.  IMPRESSION: Peribronchial thickening compatible with bronchitis.  Left base atelectasis or early pneumonia.   Original Report Authenticated By: Charlett Nose, M.D.      1. Influenza A   2. PNA (pneumonia)   3. Wheezing       MDM  Multifactorial resp disease with influenza as likely initial nidus.  Noted PNA on CXR. HCAP given occupation.  tamiflu  depomedrol for wheezing with home albuterol (likely bronchospasm in setting of flu and PNA) Rocephin 1gm IM x1 and zpak for outpt HCAP coverage.  I/S qhour home use  Plan for follow up in 2-3 days for general reassessment.  Discussed infectious and resp red flags at length, follow up as needed.     The  patient and/or caregiver has been counseled thoroughly with regard to treatment plan and/or medications prescribed including dosage, schedule, interactions, rationale for use, and possible side effects and they verbalize understanding. Diagnoses and expected course of recovery discussed and will return if not improved as expected or if the condition worsens. Patient and/or caregiver verbalized understanding.              Doree Albee, MD 02/19/12 1012

## 2012-02-19 NOTE — ED Notes (Signed)
Reports onset fever, congestion, cough and wheezing yesterday; fever 104; at work this a.m. Wheezing worse and temp 103.1> took Tylenol.Did have flu vaccination this year; works in [employer].

## 2012-02-20 ENCOUNTER — Emergency Department
Admission: EM | Admit: 2012-02-20 | Discharge: 2012-02-20 | Disposition: A | Discharge status: Home / Self Care | Payer: 59 | Source: Home / Self Care | Attending: Emergency Medicine | Admitting: Emergency Medicine

## 2012-02-20 ENCOUNTER — Encounter: Payer: Self-pay | Admitting: *Deleted

## 2012-02-20 DIAGNOSIS — J189 Pneumonia, unspecified organism: Secondary | ICD-10-CM

## 2012-02-20 DIAGNOSIS — J111 Influenza due to unidentified influenza virus with other respiratory manifestations: Secondary | ICD-10-CM

## 2012-02-20 MED ORDER — LEVOFLOXACIN 500 MG PO TABS: 500.0000 mg | ORAL_TABLET | Freq: Every day | ORAL | Status: DC

## 2012-02-20 NOTE — ED Provider Notes (Signed)
History     CSN: [id number]  Arrival date & time 02/20/12  1712   First MD Initiated Contact with Patient 02/20/12 1734      Chief Complaint  Patient presents with  . Shortness of Breath  . Pneumonia    (Consider location/radiation/quality/duration/timing/severity/associated sxs/prior treatment) HPI Valerie Underwood is a 42 y.o. female who comes back to clinic today with same symptoms as yesterday.  She has not gotten any worse, but isn't feeling better and thought she would.  She was diagnosed with influenza as well as a mild lobar community-acquired pneumonia.  And she is currently on Tamiflu as well as a Z-Pak.  She was given Rocephin and Depo-Medrol yesterday.  She is still having some short of breath, fatigue, and last a fever this morning.  She's been taking her medicine and staying home wearing a mask.  She reports that she is not getting any worse.   Past Medical History  Diagnosis Date  . Uterine fibroid     history  . History of traumatic brain injury 1997  . Anemia   . Seizures     hx- after TBI, last sz 2001    Past Surgical History  Procedure Date  . Uterine artery embolization 2009  . Dilation and curettage of uterus 1988    Family History  Problem Relation Age of Onset  . Adopted: Yes    History  Substance Use Topics  . Smoking status: Never Smoker   . Smokeless tobacco: Never Used  . Alcohol Use: 2.4 oz/week    4 Glasses of wine per week    OB History    Grav Para Term Preterm Abortions TAB SAB Ect Mult Living   1    1  1          Review of Systems  All other systems reviewed and are negative.    Allergies  Review of patient's allergies indicates no known allergies.  Home Medications   Current Outpatient Rx  Name  Route  Sig  Dispense  Refill  . ALBUTEROL SULFATE HFA 108 (90 BASE) MCG/ACT IN AERS   Inhalation   Inhale 2 puffs into the lungs every 4 (four) hours as needed for wheezing (cough, shortness of breath or wheezing.).   1 Inhaler   1     . AZITHROMYCIN 250 MG PO TABS      Take 2 tabs PO x 1 dose, then 1 tab PO QD x 4 days   6 tablet   0   . GUAIFENESIN-CODEINE 100-10 MG/5ML PO SYRP   Oral   Take 5 mLs by mouth 3 (three) times daily as needed for cough.   120 mL   0   . LEVOFLOXACIN 500 MG PO TABS   Oral   Take 1 tablet (500 mg total) by mouth daily.   10 tablet   0   . ONE-DAILY MULTI VITAMINS PO TABS   Oral   Take 1 tablet by mouth daily.           . OSELTAMIVIR PHOSPHATE 75 MG PO CAPS   Oral   Take 1 capsule (75 mg total) by mouth 2 (two) times daily.   10 capsule   0   . SERTRALINE HCL 100 MG PO TABS   Oral   Take 100 mg by mouth daily.             BP 121/83  Pulse 79  Temp 98.8 F (37.1 C) (Oral)  Resp 18  Ht  5\' 8"  (1.727 m)  Wt 287 lb (130.182 kg)  BMI 43.64 kg/m2  SpO2 99%  LMP 01/30/2012  Physical Exam  Nursing note and vitals reviewed. Constitutional: She is oriented to person, place, and time. She appears well-developed and well-nourished.  HENT:  Head: Normocephalic and atraumatic.  Right Ear: Tympanic membrane, external ear and ear canal normal.  Left Ear: Tympanic membrane, external ear and ear canal normal.  Nose: Mucosal edema and rhinorrhea present.  Mouth/Throat: Posterior oropharyngeal erythema present. No oropharyngeal exudate or posterior oropharyngeal edema.  Eyes: No scleral icterus.  Neck: Neck supple.  Cardiovascular: Regular rhythm and normal heart sounds.   Pulmonary/Chest: Effort normal. No respiratory distress. She has wheezes in the right upper field and the left upper field. She has rhonchi in the left lower field.  Neurological: She is alert and oriented to person, place, and time.  Skin: Skin is warm and dry.  Psychiatric: She has a normal mood and affect. Her speech is normal.    ED Course  Procedures (including critical care time)  Labs Reviewed - No data to display Dg Chest 2 View  02/19/2012  *RADIOLOGY REPORT*  Clinical Data: Cough, fever,  shortness of breath.  Congestion.  CHEST - 2 VIEW  Comparison: None.  Findings: Mild peribronchial thickening.  Minimal left basilar density, atelectasis or infiltrate.  Right lung is clear.  No effusions.  Heart is normal size.  No acute bony abnormality.  IMPRESSION: Peribronchial thickening compatible with bronchitis.  Left base atelectasis or early pneumonia.   Original Report Authenticated By: Charlett Nose, M.D.      1. Influenza   2. Pneumonia       MDM   I discussed with patient that the flu symptoms are likely after maximum and should start getting better with her next day or two.  For the pneumonia, and advised her that she may have residual cough and fatigue for weeks after treatment.  I also pulled her that she probably should get a repeat x-ray in the next few weeks as well to ensure clearing.  I decided to add Levaquin to her progress him in today so that she will be on that and a Z-Pak.  She can also use Tylenol for fevers.  She is going to make an appointment with her primary care physician as well in the next few weeks.  I gave her education on 1 to return to clinic such as continuing spiking fevers despite the medicine, worsening cough, worsening fatigue, worsening shortness of breath.  I suspect that she should start improving sometime in the next few days.  Until then I do not want her to be back to work.  Marlaine Hind, MD 02/20/12 [phone]

## 2012-02-20 NOTE — ED Notes (Signed)
Patient was treated for PNA and influenza yesterday by Dr. Alvester Morin. She reports she is not any better and SOB has persisted. She is taking medications as prescribed.

## 2012-02-22 ENCOUNTER — Telehealth: Payer: Self-pay | Admitting: *Deleted

## 2012-07-03 ENCOUNTER — Other Ambulatory Visit (HOSPITAL_BASED_OUTPATIENT_CLINIC_OR_DEPARTMENT_OTHER): Payer: Self-pay | Admitting: Internal Medicine

## 2012-07-03 DIAGNOSIS — Z139 Encounter for screening, unspecified: Secondary | ICD-10-CM

## 2012-08-09 ENCOUNTER — Ambulatory Visit (HOSPITAL_BASED_OUTPATIENT_CLINIC_OR_DEPARTMENT_OTHER)
Admission: RE | Admit: 2012-08-09 | Discharge: 2012-08-09 | Disposition: A | Discharge status: Home / Self Care | Payer: 59 | Source: Ambulatory Visit | Attending: Internal Medicine | Admitting: Internal Medicine

## 2012-08-09 DIAGNOSIS — Z139 Encounter for screening, unspecified: Secondary | ICD-10-CM

## 2012-08-09 DIAGNOSIS — Z1231 Encounter for screening mammogram for malignant neoplasm of breast: Secondary | ICD-10-CM | POA: Insufficient documentation

## 2013-01-22 ENCOUNTER — Ambulatory Visit (INDEPENDENT_AMBULATORY_CARE_PROVIDER_SITE_OTHER): Payer: 59 | Admitting: Family Medicine

## 2013-01-22 ENCOUNTER — Encounter: Payer: Self-pay | Admitting: Family Medicine

## 2013-01-22 VITALS — BP 124/82 | HR 85

## 2013-01-22 DIAGNOSIS — M79672 Pain in left foot: Secondary | ICD-10-CM

## 2013-01-22 DIAGNOSIS — M79609 Pain in unspecified limb: Secondary | ICD-10-CM

## 2013-01-23 ENCOUNTER — Encounter: Payer: Self-pay | Admitting: Family Medicine

## 2013-01-23 NOTE — Assessment & Plan Note (Signed)
2/2 metatarsalgia and ? Mortons neuroma in past.  Excellent results with custom orthotics so new pair made today.  F/u prn.  Patient was fitted for a : standard, cushioned, semi-rigid orthotic. The orthotic was heated and afterward the patient stood on the orthotic blank positioned on the orthotic stand. The patient was positioned in subtalar neutral position and 10 degrees of ankle dorsiflexion in a weight bearing stance. After completion of molding, a stable base was applied to the orthotic blank. The blank was ground to a stable position for weight bearing. Size: 9 red Base: blue med density eva Posting: None Additional orthotic padding: none Total prep time: 45 minutes

## 2013-01-23 NOTE — Progress Notes (Signed)
Patient ID: Valerie Underwood, female   DOB: Sep 07, 1970, 42 y.o.   MRN: [id number]  Subjective:    Patient ID: Valerie Underwood, female    DOB: 05/15/1970, 42 y.o.   MRN: [id number]  PCP: Dr. Welton Flakes  HPI 42 yo F here for f/u left foot pain.  11/11/10: Patient reports having problems with feet for approximately 20 years.  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 Has had custom orthotics made which help mildly. Has had several cortisone injections between 3rd and 4th MTs which help initially but always wear off. Podiatrist earlier this year did an aspiration and injection of 4th MTP that helped mildly as well. Most recent visit he recommended patient have surgical debridement and discussed possible fusion. Patient would like to continue with conservative care if possible. Has tried metatarsal pads but they did not feel comfortable.  05/23/11: Patient reports she did well with sports insoles and MT pads. Feels the sports insoles are now worn out and MT pads were sliding on her. Has a few months excellent relief with this though. Would like to go ahead with custom orthotics.  01/22/13: Patient returns for a new pair of orthotics. States orthotics helped her tremendously. Current ones doing ok - some bending in forefoot but still supportive. No new complaints.  Past Medical History  Diagnosis Date  . Uterine fibroid     history  . History of traumatic brain injury 1997  . Anemia   . Seizures     hx- after TBI, last sz 2001    Current Outpatient Prescriptions on File Prior to Visit  Medication Sig Dispense Refill  . albuterol (PROVENTIL HFA;VENTOLIN HFA) 108 (90 BASE) MCG/ACT inhaler Inhale 2 puffs into the lungs every 4 (four) hours as needed for wheezing (cough, shortness of breath or wheezing.).  1 Inhaler  1  . Multiple Vitamin (MULTIVITAMIN) tablet Take 1 tablet by mouth daily.        . sertraline (ZOLOFT) 100 MG tablet Take 100 mg by mouth daily.          No current facility-administered medications on file prior to visit.    Past Surgical History  Procedure Laterality Date  . Uterine artery embolization  2009  . Dilation and curettage of uterus  1988    No Known Allergies  History   Social History  . Marital Status: Single    Spouse Name: N/A    Number of Children: N/A  . Years of Education: N/A   Occupational History  . Not on file.   Social History Main Topics  . Smoking status: Never Smoker   . Smokeless tobacco: Never Used  . Alcohol Use: 2.4 oz/week    4 Glasses of wine per week  . Drug Use: No  . Sexual Activity: Yes    Birth Control/ Protection: None   Other Topics Concern  . Not on file   Social History Narrative  . No narrative on file    Family History  Problem Relation Age of Onset  . Adopted: Yes    BP 124/82  Pulse 85  Review of Systems See HPI above.    Objective:   Physical Exam Gen: NAD L foot: Mod collapse of transverse arch but no callus formation.  L worse than R foot. No hallux valgus or rigidus. No TTP. No palpable neuroma on exam. FROM ankle, digits without limitation at MTP joints. NVI distally  R foot: Mod transverse arch  collapse, no callus.  No hallux valgus or rigidus. No TTP.    Assessment & Plan:  1. Left foot pain - 2/2 metatarsalgia and ? Mortons neuroma in past.  Excellent results with custom orthotics so new pair made today.  F/u prn.  Patient was fitted for a : standard, cushioned, semi-rigid orthotic. The orthotic was heated and afterward the patient stood on the orthotic blank positioned on the orthotic stand. The patient was positioned in subtalar neutral position and 10 degrees of ankle dorsiflexion in a weight bearing stance. After completion of molding, a stable base was applied to the orthotic blank. The blank was ground to a stable position for weight bearing. Size: 9 red Base: blue med density eva Posting: None Additional orthotic padding:  none Total prep time: 45 minutes

## 2013-06-01 ENCOUNTER — Ambulatory Visit: Payer: Self-pay | Admitting: Emergency Medicine

## 2013-06-01 LAB — RAPID STREP-A WITH REFLX: Micro Text Report: NEGATIVE

## 2013-06-04 LAB — BETA STREP CULTURE(ARMC)

## 2013-08-25 ENCOUNTER — Other Ambulatory Visit (HOSPITAL_BASED_OUTPATIENT_CLINIC_OR_DEPARTMENT_OTHER): Payer: Self-pay | Admitting: Internal Medicine

## 2013-08-25 DIAGNOSIS — Z139 Encounter for screening, unspecified: Secondary | ICD-10-CM

## 2013-09-17 ENCOUNTER — Ambulatory Visit (HOSPITAL_BASED_OUTPATIENT_CLINIC_OR_DEPARTMENT_OTHER)
Admission: RE | Admit: 2013-09-17 | Discharge: 2013-09-17 | Disposition: A | Discharge status: Home / Self Care | Payer: 59 | Source: Ambulatory Visit | Attending: Internal Medicine | Admitting: Internal Medicine

## 2013-09-17 DIAGNOSIS — Z139 Encounter for screening, unspecified: Secondary | ICD-10-CM

## 2013-09-17 DIAGNOSIS — Z1231 Encounter for screening mammogram for malignant neoplasm of breast: Secondary | ICD-10-CM | POA: Insufficient documentation

## 2013-09-29 ENCOUNTER — Ambulatory Visit: Payer: Self-pay

## 2013-10-14 ENCOUNTER — Ambulatory Visit: Payer: Self-pay

## 2013-12-15 ENCOUNTER — Encounter: Payer: Self-pay | Admitting: Family Medicine

## 2014-01-20 ENCOUNTER — Encounter (INDEPENDENT_AMBULATORY_CARE_PROVIDER_SITE_OTHER): Payer: Self-pay

## 2014-01-20 ENCOUNTER — Encounter: Payer: Self-pay | Admitting: Family Medicine

## 2014-01-20 ENCOUNTER — Ambulatory Visit (INDEPENDENT_AMBULATORY_CARE_PROVIDER_SITE_OTHER): Payer: Commercial Managed Care - PPO | Admitting: Family Medicine

## 2014-01-20 VITALS — Ht 68.0 in

## 2014-01-20 DIAGNOSIS — M79672 Pain in left foot: Secondary | ICD-10-CM

## 2014-01-20 NOTE — Patient Instructions (Signed)
You have plantar fasciitis Take tylenol or aleve as needed for pain  Plantar fascia stretch for 20-30 seconds (do 3 of these) in morning Lowering/raise on a step exercises 3 x 10 once or twice a day - this is very important for long term recovery. Can add heel walks, toe walks forward and backward as well Ice heel for 15 minutes as needed. Avoid flat shoes/barefoot walking as much as possible. Arch straps have been shown to help with pain. Orthotics with heel lift may be helpful. Steroid injection is a consideration for short term pain relief if you are struggling. Physical therapy is also an option. Call me if you're not improving over 5-6 weeks as expected.

## 2014-01-21 ENCOUNTER — Encounter: Payer: Self-pay | Admitting: Family Medicine

## 2014-01-21 NOTE — Assessment & Plan Note (Signed)
2/2 metatarsalgia and ? Mortons neuroma in past.  Excellent results with custom orthotics so new pair made today.  Also reviewed plantar fascia exercises, arch binders.  See instructions for further.  F/u prn.  Patient was fitted for a : standard, cushioned, semi-rigid orthotic. The orthotic was heated and afterward the patient stood on the orthotic blank positioned on the orthotic stand. The patient was positioned in subtalar neutral position and 10 degrees of ankle dorsiflexion in a weight bearing stance. After completion of molding, a stable base was applied to the orthotic blank. The blank was ground to a stable position for weight bearing. Size: 11 red Base: blue med density eva Posting: None Additional orthotic padding: none Total prep time: 45 minutes

## 2014-01-21 NOTE — Progress Notes (Signed)
Patient ID: Valerie Underwood, female   DOB: 1970-11-16, 43 y.o.   MRN: [id number]  Subjective:    Patient ID: Valerie Underwood, female    DOB: Mar 13, 1970, 43 y.o.   MRN: [id number]  PCP: Dr. Humphrey Rolls  HPI 43 yo F here for f/u left foot pain.  11/11/10: Patient reports having problems with feet for approximately 20 years.  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 Has had custom orthotics made which help mildly. Has had several cortisone injections between 3rd and 4th MTs which help initially but always wear off. Podiatrist earlier this year did an aspiration and injection of 4th MTP that helped mildly as well. Most recent visit he recommended patient have surgical debridement and discussed possible fusion. Patient would like to continue with conservative care if possible. Has tried metatarsal pads but they did not feel comfortable.  05/23/11: Patient reports she did well with sports insoles and MT pads. Feels the sports insoles are now worn out and MT pads were sliding on her. Has a few months excellent relief with this though. Would like to go ahead with custom orthotics.  01/22/13: Patient returns for a new pair of orthotics. States orthotics helped her tremendously. Current ones doing ok - some bending in forefoot but still supportive. No new complaints.  01/21/14: Patient returns with some plantar heel pain but also for new pair of orthotics.  Past Medical History  Diagnosis Date  . Uterine fibroid     history  . History of traumatic brain injury 1997  . Anemia   . Seizures     hx- after TBI, last sz 2001    Current Outpatient Prescriptions on File Prior to Visit  Medication Sig Dispense Refill  . albuterol (PROVENTIL HFA;VENTOLIN HFA) 108 (90 BASE) MCG/ACT inhaler Inhale 2 puffs into the lungs every 4 (four) hours as needed for wheezing (cough, shortness of breath or wheezing.). 1 Inhaler 1  . Multiple Vitamin (MULTIVITAMIN) tablet Take 1 tablet  by mouth daily.      . sertraline (ZOLOFT) 100 MG tablet Take 100 mg by mouth daily.       No current facility-administered medications on file prior to visit.    Past Surgical History  Procedure Laterality Date  . Uterine artery embolization  2009  . Dilation and curettage of uterus  1988    No Known Allergies  History   Social History  . Marital Status: Single    Spouse Name: N/A    Number of Children: N/A  . Years of Education: N/A   Occupational History  . Not on file.   Social History Main Topics  . Smoking status: Never Smoker   . Smokeless tobacco: Never Used  . Alcohol Use: 2.4 oz/week    4 Glasses of wine per week  . Drug Use: No  . Sexual Activity: Yes    Birth Control/ Protection: None   Other Topics Concern  . Not on file   Social History Narrative    Family History  Problem Relation Age of Onset  . Adopted: Yes    Ht 5\' 8"  (1.727 m)  Review of Systems See HPI above.    Objective:   Physical Exam Gen: NAD L foot: Mod collapse of transverse arch but no callus formation.  L worse than R foot. No hallux valgus or rigidus. TTP plantar fascia insertion on calcaneus plantar foot. FROM ankle, digits. NVI distally  R foot: Mod transverse arch  collapse, no callus.  No hallux valgus or rigidus. No TTP.    Assessment & Plan:  1. Left foot pain - 2/2 metatarsalgia and ? Mortons neuroma in past.  Excellent results with custom orthotics so new pair made today.  Also reviewed plantar fascia exercises, arch binders.  See instructions for further.  F/u prn.  Patient was fitted for a : standard, cushioned, semi-rigid orthotic. The orthotic was heated and afterward the patient stood on the orthotic blank positioned on the orthotic stand. The patient was positioned in subtalar neutral position and 10 degrees of ankle dorsiflexion in a weight bearing stance. After completion of molding, a stable base was applied to the orthotic blank. The blank was  ground to a stable position for weight bearing. Size: 11 red Base: blue med density eva Posting: None Additional orthotic padding: none Total prep time: 45 minutes

## 2014-09-28 ENCOUNTER — Encounter: Payer: 59 | Attending: Family Medicine | Admitting: Dietician

## 2014-09-28 DIAGNOSIS — Z713 Dietary counseling and surveillance: Secondary | ICD-10-CM | POA: Diagnosis present

## 2014-09-28 NOTE — Patient Instructions (Signed)
   Plan to spread out protein intake throughout the day, perhaps switching am snack with dinner and lunch options.   Continue with regular exercise, build intensity as strength and energy increase.   Control starch portions by starting with small (i.e. 1/2 cup) portions, either allowing for seconds if needed, or putting leftovers away -- out of reach. Can also think about using smaller plates.   Track protein intake or other foods, food groups.

## 2014-09-28 NOTE — Progress Notes (Signed)
Notes from [facility] employee "self referral" nutrition session: Start time: 0930   End time1030  Met with employee to discuss his/her nutritional concerns and diet history. The employee's questions/concerns were also addressed.  We discussed the following topics:  Healthy Eating  Exercise  Weight Concerns  I also provided the following handouts as reinforcement of the educational session:  Planning a Balanced Meal   Additional Comments:   Instructed on 1700kcal meal plan, including 14 servings CHO, 7-8oz protein, and 6 servings added fats daily.  Discussed portion control, meal and snack schedule.  Patient seems to be taking in significant amount of protein in am, but little in the evenings.     Goals Agreed Upon:  See patient instructions

## 2014-10-25 ENCOUNTER — Ambulatory Visit
Admission: EM | Admit: 2014-10-25 | Discharge: 2014-10-25 | Disposition: A | Discharge status: Home / Self Care | Payer: Commercial Managed Care - PPO | Attending: Family Medicine | Admitting: Family Medicine

## 2014-10-25 DIAGNOSIS — J0101 Acute recurrent maxillary sinusitis: Secondary | ICD-10-CM | POA: Diagnosis not present

## 2014-10-25 DIAGNOSIS — J302 Other seasonal allergic rhinitis: Secondary | ICD-10-CM

## 2014-10-25 DIAGNOSIS — H6593 Unspecified nonsuppurative otitis media, bilateral: Secondary | ICD-10-CM

## 2014-10-25 HISTORY — DX: Depression, unspecified: F32.A

## 2014-10-25 HISTORY — DX: Major depressive disorder, single episode, unspecified: F32.9

## 2014-10-25 MED ORDER — HYDROCOD POLST-CPM POLST ER 10-8 MG/5ML PO SUER: 5.0000 mL | Freq: Every evening | ORAL | Status: DC | PRN

## 2014-10-25 MED ORDER — AMOXICILLIN-POT CLAVULANATE 875-125 MG PO TABS: 1.0000 | ORAL_TABLET | Freq: Two times a day (BID) | ORAL | Status: DC

## 2014-10-25 MED ORDER — GUAIFENESIN-CODEINE 100-10 MG/5ML PO SOLN: 5.0000 mL | Freq: Two times a day (BID) | ORAL | Status: DC | PRN

## 2014-10-25 NOTE — Discharge Instructions (Signed)
Allergic Rhinitis Allergic rhinitis is when the mucous membranes in the nose respond to allergens. Allergens are particles in the air that cause your body to have an allergic reaction. This causes you to release allergic antibodies. Through a chain of events, these eventually cause you to release histamine into the blood stream. Although meant to protect the body, it is this release of histamine that causes your discomfort, such as frequent sneezing, congestion, and an itchy, runny nose.  CAUSES  Seasonal allergic rhinitis (hay fever) is caused by pollen allergens that may come from grasses, trees, and weeds. Year-round allergic rhinitis (perennial allergic rhinitis) is caused by allergens such as house dust mites, pet dander, and mold spores.  SYMPTOMS   Nasal stuffiness (congestion).  Itchy, runny nose with sneezing and tearing of the eyes. DIAGNOSIS  Your health care provider can help you determine the allergen or allergens that trigger your symptoms. If you and your health care provider are unable to determine the allergen, skin or blood testing may be used. TREATMENT  Allergic rhinitis does not have a cure, but it can be controlled by:  Medicines and allergy shots (immunotherapy).  Avoiding the allergen. Hay fever may often be treated with antihistamines in pill or nasal spray forms. Antihistamines block the effects of histamine. There are over-the-counter medicines that may help with nasal congestion and swelling around the eyes. Check with your health care provider before taking or giving this medicine.  If avoiding the allergen or the medicine prescribed do not work, there are many new medicines your health care provider can prescribe. Stronger medicine may be used if initial measures are ineffective. Desensitizing injections can be used if medicine and avoidance does not work. Desensitization is when a patient is given ongoing shots until the body becomes less sensitive to the allergen.  Make sure you follow up with your health care provider if problems continue. HOME CARE INSTRUCTIONS It is not possible to completely avoid allergens, but you can reduce your symptoms by taking steps to limit your exposure to them. It helps to know exactly what you are allergic to so that you can avoid your specific triggers. SEEK MEDICAL CARE IF:   You have a fever.  You develop a cough that does not stop easily (persistent).  You have shortness of breath. You start wheezing.Pharyngitis Pharyngitis is redness, pain, and swelling (inflammation) of your pharynx.  CAUSES  Pharyngitis is usually caused by infection. Most of the time, these infections are from viruses (viral) and are part of a cold. However, sometimes pharyngitis is caused by bacteria (bacterial). Pharyngitis can also be caused by allergies. Viral pharyngitis may be spread from person to person by coughing, sneezing, and personal items or utensils (cups, forks, spoons, toothbrushes). Bacterial pharyngitis may be spread from person to person by more intimate contact, such as kissing.  SIGNS AND SYMPTOMS  Symptoms of pharyngitis include:   Sore throat.   Tiredness (fatigue).   Low-grade fever.   Headache.  Joint pain and muscle aches.  Skin rashes.  Swollen lymph nodes.  Plaque-like film on throat or tonsils (often seen with bacterial pharyngitis). DIAGNOSIS  Your health care provider will ask you questions about your illness and your symptoms. Your medical history, along with a physical exam, is often all that is needed to diagnose pharyngitis. Sometimes, a rapid strep test is done. Other lab tests may also be done, depending on the suspected cause.  TREATMENT  Viral pharyngitis will usually get better in 3-4 days without  the use of medicine. Bacterial pharyngitis is treated with medicines that kill germs (antibiotics).  HOME CARE INSTRUCTIONS   Drink enough water and fluids to keep your urine clear or pale  yellow.   Only take over-the-counter or prescription medicines as directed by your health care provider:   If you are prescribed antibiotics, make sure you finish them even if you start to feel better.   Do not take aspirin.   Get lots of rest.   Gargle with 8 oz of salt water ( tsp of salt per 1 qt of water) as often as every 1-2 hours to soothe your throat.   Throat lozenges (if you are not at risk for choking) or sprays may be used to soothe your throat. SEEK MEDICAL CARE IF:   You have large, tender lumps in your neck.  You have a rash.  You cough up green, yellow-brown, or bloody spit. SEEK IMMEDIATE MEDICAL CARE IF:   Your neck becomes stiff.  You drool or are unable to swallow liquids.  You vomit or are unable to keep medicines or liquids down.  You have severe pain that does not go away with the use of recommended medicines.  You have trouble breathing (not caused by a stuffy nose). MAKE SURE YOU:   Understand these instructions.  Will watch your condition.  Will get help right away if you are not doing well or get worse. Document Released: 01/30/2005 Document Revised: 11/20/2012 Document Reviewed: 10/07/2012 [facility] Patient Information 2015 [place], [place]. This information is not intended to replace advice given to you by your health care provider. Make sure you discuss any questions you have with your health care provider. Otitis Media Otitis media is redness, soreness, and inflammation of the middle ear. Otitis media may be caused by allergies or, most commonly, by infection. Often it occurs as a complication of the common cold. SIGNS AND SYMPTOMS Symptoms of otitis media may include:  Earache.  Fever.  Ringing in your ear.  Headache.  Leakage of fluid from the ear. DIAGNOSIS To diagnose otitis media, your health care provider will examine your ear with an otoscope. This is an instrument that allows your health care provider to see into your  ear in order to examine your eardrum. Your health care provider also will ask you questions about your symptoms. TREATMENT  Typically, otitis media resolves on its own within 3-5 days. Your health care provider may prescribe medicine to ease your symptoms of pain. If otitis media does not resolve within 5 days or is recurrent, your health care provider may prescribe antibiotic medicines if he or she suspects that a bacterial infection is the cause. HOME CARE INSTRUCTIONS   If you were prescribed an antibiotic medicine, finish it all even if you start to feel better.  Take medicines only as directed by your health care provider.  Keep all follow-up visits as directed by your health care provider. SEEK MEDICAL CARE IF:  You have otitis media only in one ear, or bleeding from your nose, or both.  You notice a lump on your neck.  You are not getting better in 3-5 days.  You feel worse instead of better. SEEK IMMEDIATE MEDICAL CARE IF:   You have pain that is not controlled with medicine.  You have swelling, redness, or pain around your ear or stiffness in your neck.  You notice that part of your face is paralyzed.  You notice that the bone behind your ear (mastoid) is tender when  you touch it. MAKE SURE YOU:   Understand these instructions.  Will watch your condition.  Will get help right away if you are not doing well or get worse. Document Released: 11/05/2003 Document Revised: 06/16/2013 Document Reviewed: 08/27/2012 [facility] Patient Information 2015 [place], [place]. This information is not intended to replace advice given to you by your health care provider. Make sure you discuss any questions you have with your health care provider.  Symptoms interfere with normal daily activities. Document Released: 10/25/2000 Document Revised: 02/04/2013 Document Reviewed: 10/07/2012 [facility] Patient Information 2015 [place], [place]. This information is not intended to replace advice given  to you by your health care provider. Make sure you discuss any questions you have with your health care provider. Otitis Media With Effusion Otitis media with effusion is the presence of fluid in the middle ear. This is a common problem in children, which often follows ear infections. It may be present for weeks or longer after the infection. Unlike an acute ear infection, otitis media with effusion refers only to fluid behind the ear drum and not infection. Children with repeated ear and sinus infections and allergy problems are the most likely to get otitis media with effusion. CAUSES  The most frequent cause of the fluid buildup is dysfunction of the eustachian tubes. These are the tubes that drain fluid in the ears to the back of the nose (nasopharynx). SYMPTOMS   The main symptom of this condition is hearing loss. As a result, you or your child may:  Listen to the TV at a loud volume.  Not respond to questions.  Ask "what" often when spoken to.  Mistake or confuse one sound or word for another.  There may be a sensation of fullness or pressure but usually not pain. DIAGNOSIS   Your health care provider will diagnose this condition by examining you or your child's ears.  Your health care provider may test the pressure in you or your child's ear with a tympanometer.  A hearing test may be conducted if the problem persists. TREATMENT   Treatment depends on the duration and the effects of the effusion.  Antibiotics, decongestants, nose drops, and cortisone-type drugs (tablets or nasal spray) may not be helpful.  Children with persistent ear effusions may have delayed language or behavioral problems. Children at risk for developmental delays in hearing, learning, and speech may require referral to a specialist earlier than children not at risk.  You or your child's health care provider may suggest a referral to an ear, nose, and throat surgeon for treatment. The following may help  restore normal hearing:  Drainage of fluid.  Placement of ear tubes (tympanostomy tubes).  Removal of adenoids (adenoidectomy). HOME CARE INSTRUCTIONS   Avoid secondhand smoke.  Infants who are breastfed are less likely to have this condition.  Avoid feeding infants while they are lying flat.  Avoid known environmental allergens.  Avoid people who are sick. SEEK MEDICAL CARE IF:   Hearing is not better in 3 months.  Hearing is worse.  Ear pain.  Drainage from the ear.  Dizziness. MAKE SURE YOU:   Understand these instructions.  Will watch your condition.  Will get help right away if you are not doing well or get worse. Document Released: 03/09/2004 Document Revised: 06/16/2013 Document Reviewed: 08/27/2012 [facility] Patient Information 2015 [place], [place]. This information is not intended to replace advice given to you by your health care provider. Make sure you discuss any questions you have with your health  care provider. Sinusitis Sinusitis is redness, soreness, and inflammation of the paranasal sinuses. Paranasal sinuses are air pockets within the bones of your face (beneath the eyes, the middle of the forehead, or above the eyes). In healthy paranasal sinuses, mucus is able to drain out, and air is able to circulate through them by way of your nose. However, when your paranasal sinuses are inflamed, mucus and air can become trapped. This can allow bacteria and other germs to grow and cause infection. Sinusitis can develop quickly and last only a short time (acute) or continue over a long period (chronic). Sinusitis that lasts for more than 12 weeks is considered chronic.  CAUSES  Causes of sinusitis include:  Allergies.  Structural abnormalities, such as displacement of the cartilage that separates your nostrils (deviated septum), which can decrease the air flow through your nose and sinuses and affect sinus drainage.  Functional abnormalities, such as when the  small hairs (cilia) that line your sinuses and help remove mucus do not work properly or are not present. SIGNS AND SYMPTOMS  Symptoms of acute and chronic sinusitis are the same. The primary symptoms are pain and pressure around the affected sinuses. Other symptoms include:  Upper toothache.  Earache.  Headache.  Bad breath.  Decreased sense of smell and taste.  A cough, which worsens when you are lying flat.  Fatigue.  Fever.  Thick drainage from your nose, which often is green and may contain pus (purulent).  Swelling and warmth over the affected sinuses. DIAGNOSIS  Your health care provider will perform a physical exam. During the exam, your health care provider may:  Look in your nose for signs of abnormal growths in your nostrils (nasal polyps).  Tap over the affected sinus to check for signs of infection.  View the inside of your sinuses (endoscopy) using an imaging device that has a light attached (endoscope). If your health care provider suspects that you have chronic sinusitis, one or more of the following tests may be recommended:  Allergy tests.  Nasal culture. A sample of mucus is taken from your nose, sent to a lab, and screened for bacteria.  Nasal cytology. A sample of mucus is taken from your nose and examined by your health care provider to determine if your sinusitis is related to an allergy. TREATMENT  Most cases of acute sinusitis are related to a viral infection and will resolve on their own within 10 days. Sometimes medicines are prescribed to help relieve symptoms (pain medicine, decongestants, nasal steroid sprays, or saline sprays).  However, for sinusitis related to a bacterial infection, your health care provider will prescribe antibiotic medicines. These are medicines that will help kill the bacteria causing the infection.  Rarely, sinusitis is caused by a fungal infection. In theses cases, your health care provider will prescribe antifungal  medicine. For some cases of chronic sinusitis, surgery is needed. Generally, these are cases in which sinusitis recurs more than 3 times per year, despite other treatments. HOME CARE INSTRUCTIONS   Drink plenty of water. Water helps thin the mucus so your sinuses can drain more easily.  Use a humidifier.  Inhale steam 3 to 4 times a day (for example, sit in the bathroom with the shower running).  Apply a warm, moist washcloth to your face 3 to 4 times a day, or as directed by your health care provider.  Use saline nasal sprays to help moisten and clean your sinuses.  Take medicines only as directed by your health  care provider.  If you were prescribed either an antibiotic or antifungal medicine, finish it all even if you start to feel better. SEEK IMMEDIATE MEDICAL CARE IF:  You have increasing pain or severe headaches.  You have nausea, vomiting, or drowsiness.  You have swelling around your face.  You have vision problems.  You have a stiff neck.  You have difficulty breathing. MAKE SURE YOU:   Understand these instructions.  Will watch your condition.  Will get help right away if you are not doing well or get worse. Document Released: 01/30/2005 Document Revised: 06/16/2013 Document Reviewed: 02/14/2011 [facility] Patient Information 2015 [place], [place]. This information is not intended to replace advice given to you by your health care provider. Make sure you discuss any questions you have with your health care provider.

## 2014-10-25 NOTE — ED Notes (Signed)
Patient has been sick for the past week with cough, congestion and some sore throat. Some productivty with cough. White to yellow.

## 2014-10-25 NOTE — ED Provider Notes (Signed)
CSN: [id number]     Arrival date & time 10/25/14  0805 History   First MD Initiated Contact with Patient 10/25/14 0830     Chief Complaint  Patient presents with  . Cough  . Nasal Congestion  . 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  Last seen by PA Roemer 01 Jun 2013 requesting prescription cough medicine with codeine as ran out from 2015 visit and can't sleep without it.  Not working again until Tuesday.  Sick contact exposure at work and seasonal allergies hasn't been using her flonase at home.  Hydrocodone makes her throw up has been a couple years since she had narcotics.  Patient is a 44 y.o. female presenting with cough and pharyngitis. The history is provided by the patient.  Cough Cough characteristics:  Non-productive Severity:  Moderate Onset quality:  Sudden Duration:  1 week Timing:  Intermittent Progression:  Unchanged Chronicity:  New Smoker: no   Context: exposure to allergens   Context: not animal exposure   Associated symptoms: rhinorrhea and sore throat   Associated symptoms: no chest pain, no chills, no diaphoresis, no ear pain, no eye discharge, no fever, no headaches, no myalgias, no rash, no shortness of breath and no wheezing   Sore Throat Pertinent negatives include no chest pain, no abdominal pain, no headaches and no shortness of breath.    Past Medical History  Diagnosis Date  . Uterine fibroid     history  . History of traumatic brain injury 1997  . Anemia   . Seizures     hx- after TBI, last sz 2001  . Depression    Past Surgical History  Procedure Laterality Date  . Uterine artery embolization  2009  . Dilation and curettage of uterus  1988   Family History   Problem Relation Age of Onset  . Adopted: Yes   Social History  Substance Use Topics  . Smoking status: Never Smoker   . Smokeless tobacco: Never Used  . Alcohol Use: 2.4 oz/week    4 Glasses of wine per week   OB History    Gravida Para Term Preterm AB TAB SAB Ectopic Multiple Living   1    1  1         Review of Systems  Constitutional: Positive for fatigue. Negative for fever, chills, diaphoresis, activity change and appetite change.  HENT: Positive for postnasal drip, rhinorrhea, sinus pressure, sneezing, sore throat and voice change. Negative for congestion, dental problem, drooling, ear discharge, ear pain, facial swelling, hearing loss, mouth sores, nosebleeds, tinnitus and trouble swallowing.   Eyes: Negative for photophobia, pain, discharge, redness, itching and visual disturbance.  Respiratory: Positive for cough. Negative for choking, chest tightness, shortness of breath, wheezing and stridor.   Cardiovascular: Negative for chest pain, palpitations and leg swelling.  Gastrointestinal: Negative for nausea, vomiting, abdominal pain, diarrhea, constipation, blood in stool, abdominal distention, anal bleeding and rectal pain.  Endocrine: Negative for cold intolerance and heat intolerance.  Genitourinary: Negative for dysuria and hematuria.  Musculoskeletal: Negative for myalgias, back pain, joint swelling, arthralgias, gait problem, neck pain and neck stiffness.  Skin: Negative for color change, pallor, rash and wound.  Allergic/Immunologic: Positive for environmental allergies. Negative for food allergies.  Neurological: Negative for dizziness, tremors, seizures, syncope, facial asymmetry, speech  difficulty, weakness, light-headedness, numbness and headaches.  Hematological: Negative for adenopathy. Does not bruise/bleed easily.  Psychiatric/Behavioral: Positive for sleep disturbance. Negative for behavioral problems, confusion and agitation.    Allergies  Statins and  Benadryl  Home Medications   Prior to Admission medications   Medication Sig Start Date End Date Taking? Authorizing Provider  amoxicillin-clavulanate (AUGMENTIN) 875-125 MG per tablet Take 1 tablet by mouth every 12 (twelve) hours. 10/25/14   Olen Cordial, NP  chlorpheniramine-HYDROcodone (TUSSIONEX PENNKINETIC ER) 10-8 MG/5ML SUER Take 5 mLs by mouth at bedtime as needed for cough. 10/25/14   Olen Cordial, NP  Multiple Vitamin (MULTIVITAMIN) tablet Take 1 tablet by mouth daily.      Historical Provider, MD  sertraline (ZOLOFT) 100 MG tablet Take 100 mg by mouth daily.      Historical Provider, MD   Meds Ordered and Administered this Visit  Medications - No data to display  BP 148/81 mmHg  Pulse 77  Temp(Src) 97.4 F (36.3 C) (Tympanic)  Resp 20  Ht 5\' 8"  (1.727 m)  Wt 290 lb (131.543 kg)  BMI 44.10 kg/m2  SpO2 99% No data found.   Physical Exam  Constitutional: She is oriented to person, place, and time. Vital signs are normal. She appears well-developed and well-nourished. No distress.  HENT:  Head: Normocephalic and atraumatic.  Right Ear: Hearing, external ear and ear canal normal. A middle ear effusion is present.  Left Ear: Hearing, external ear and ear canal normal. A middle ear effusion is present.  Nose: Mucosal edema and rhinorrhea present. No nose lacerations, sinus tenderness, nasal deformity, septal deviation or nasal septal hematoma. No epistaxis.  No foreign bodies. Right sinus exhibits maxillary sinus tenderness. Right sinus exhibits no frontal sinus tenderness. Left sinus exhibits maxillary sinus tenderness. Left sinus exhibits no frontal sinus tenderness.  Mouth/Throat: Uvula is midline and mucous membranes are normal. Mucous membranes are not pale, not dry and not cyanotic. She does not have dentures. No oral lesions. No trismus in the jaw. Normal dentition. No dental abscesses, uvula swelling, lacerations or dental caries. Posterior oropharyngeal edema  and posterior oropharyngeal erythema present. No oropharyngeal exudate or tonsillar abscesses.  Cobblestoning posterior pharynx; bilateral TMs with air fluid level slight opacity left and vasculature TM excoriated right; bilateral nasal turbinates with edema/erythema  Eyes: Conjunctivae, EOM and lids are normal. Pupils are equal, round, and reactive to light. Right eye exhibits no discharge. Left eye exhibits no discharge. No scleral icterus.  Neck: Trachea normal and normal range of motion. Neck supple. No tracheal deviation present. No thyromegaly present.  Cardiovascular: Normal rate, regular rhythm, normal heart sounds and intact distal pulses.  Exam reveals no gallop and no friction rub.   No murmur heard. Pulmonary/Chest: Effort normal and breath sounds normal. No stridor. No respiratory distress. She has no wheezes. She has no rales. She exhibits no tenderness.  Abdominal: Soft. She exhibits no distension.  Musculoskeletal: Normal range of motion. She exhibits no edema or tenderness.  Lymphadenopathy:    She has no cervical adenopathy.  Neurological: She is alert and oriented to person, place, and time. She exhibits normal muscle tone. Coordination normal.  Skin: Skin is warm, dry and intact. No rash noted. She is not diaphoretic. No erythema. No pallor.  Psychiatric: She has a normal mood and affect. Her speech is normal and behavior is normal. Judgment and thought content normal. Cognition and memory are normal.  Nursing note and vitals reviewed.   ED Course  Procedures (including critical care time)  Labs Review Labs Reviewed - No data to display  Imaging Review No results found.   [place] Controlled substances registry no narcotics in 2016 MDM   1. Other seasonal allergic rhinitis   2. Acute recurrent maxillary sinusitis   3. Otitis media with effusion, bilateral    Patient may use normal saline nasal spray as needed.  Restart flonase 1 spray each nostril  patient has at home.  Consider antihistamine use.  Codeine/guaifenesin 60ml at bedtime for post nasal drip/cough.  Discussed no driving and avoid alcohol intake while taking tussionex as may cause excessive drowsiness. Avoid triggers if possible.  Shower prior to bedtime if exposed to triggers.  If allergic dust/dust mites recommend mattress/pillow covers/encasements; washing linens, vacuuming, sweeping, dusting weekly.  Call or return to clinic as needed if these symptoms worsen or fail to improve as anticipated.   Exitcare handout on allergic rhinitis given to patient.  Patient verbalized understanding of instructions, agreed with plan of care and had no further questions at this time.  P2:  Avoidance and hand washing.  Continue neti pot.  Add nasal saline 2 sprays each nostril q2h while awake.  Restart flonase 1 spray each nostril BID.  If no relief x 48 hours start augmentin 875mg  po BID x 10 days.  Rx given.  No evidence of systemic bacterial infection, non toxic and well hydrated.  I do not see where any further testing or imaging is necessary at this time.   I will suggest supportive care, rest, good hygiene and encourage the patient to take adequate fluids.  The patient is to return to clinic or EMERGENCY ROOM if symptoms worsen or change significantly.  Exitcare handout on sinusitis given to patient.  Patient verbalized agreement and understanding of treatment plan and had no further questions at this time.   P2:  Hand washing and cover cough  Discussed fluid slightly cloudy in ear if worsening pain, fever, ear discharge start augmentin 875mg  po BID x 10 days Rx given to patient.  Supportive treatment.   No evidence of invasive bacterial infection, non toxic and well hydrated.  This is most likely self limiting viral infection.  I do not see where any further testing or imaging is necessary at this time.   I will suggest supportive care, rest, good hygiene and encourage the patient to take adequate  fluids.  The patient is to return to clinic or EMERGENCY ROOM if symptoms worsen or change significantly e.g. ear pain, fever, purulent discharge from ears or bleeding.  Exitcare handout on otitis media with effusion given to patient.  Patient verbalized agreement and understanding of treatment plan.    Postnasal drip related sore throat.  Usually no specific medical treatment is needed if a virus is causing the sore throat.  The throat most often gets better on its own within 5 to 7 days.  Antibiotic medicine does not cure viral pharyngitis.   For acute pharyngitis caused by bacteria, your healthcare provider will prescribe an antibiotic.  Marland Kitchen Do not smoke.  Marland Kitchen Avoid secondhand smoke and other air pollutants.  . Use a cool mist humidifier to add moisture to the air.  . Get plenty of rest.  . You may want to rest your throat by talking less and eating a diet that is mostly liquid or soft for a day or two.   Marland Kitchen Nonprescription throat lozenges and mouthwashes should help relieve the soreness.   Holli Humbles with  warm saltwater and drinking warm liquids may help.  (You can make a saltwater solution by adding 1/4 teaspoon of salt to 8 ounces, or 240 mL, of warm water.)  . A nonprescription pain reliever such as aspirin, acetaminophen, or ibuprofen may ease general aches and pains.   FOLLOW UP with clinic provider if no improvements in the next 7-10 days.  Patient verbalized understanding of instructions and agreed with plan of care. P2:  Hand washing and diet.   Olen Cordial, NP 10/25/14 [id number]

## 2014-12-11 ENCOUNTER — Other Ambulatory Visit: Payer: Self-pay | Admitting: Family Medicine

## 2014-12-11 MED ORDER — SERTRALINE HCL 100 MG PO TABS: 100.0000 mg | ORAL_TABLET | Freq: Every day | ORAL | Status: DC

## 2015-01-01 ENCOUNTER — Other Ambulatory Visit: Payer: Self-pay | Admitting: Family Medicine

## 2015-01-01 DIAGNOSIS — Z1231 Encounter for screening mammogram for malignant neoplasm of breast: Secondary | ICD-10-CM

## 2015-01-04 ENCOUNTER — Ambulatory Visit (HOSPITAL_BASED_OUTPATIENT_CLINIC_OR_DEPARTMENT_OTHER)
Admission: RE | Admit: 2015-01-04 | Discharge: 2015-01-04 | Disposition: A | Discharge status: Home / Self Care | Payer: BLUE CROSS/BLUE SHIELD | Source: Ambulatory Visit | Attending: Family Medicine | Admitting: Family Medicine

## 2015-01-04 DIAGNOSIS — Z1231 Encounter for screening mammogram for malignant neoplasm of breast: Secondary | ICD-10-CM | POA: Diagnosis present

## 2015-03-09 ENCOUNTER — Ambulatory Visit (INDEPENDENT_AMBULATORY_CARE_PROVIDER_SITE_OTHER): Payer: BLUE CROSS/BLUE SHIELD | Admitting: Family Medicine

## 2015-03-09 ENCOUNTER — Encounter: Payer: Self-pay | Admitting: Family Medicine

## 2015-03-09 VITALS — BP 130/70 | HR 80 | Ht 68.0 in | Wt 312.0 lb

## 2015-03-09 DIAGNOSIS — Z789 Other specified health status: Secondary | ICD-10-CM | POA: Diagnosis not present

## 2015-03-09 DIAGNOSIS — L301 Dyshidrosis [pompholyx]: Secondary | ICD-10-CM | POA: Diagnosis not present

## 2015-03-09 DIAGNOSIS — R29898 Other symptoms and signs involving the musculoskeletal system: Secondary | ICD-10-CM

## 2015-03-09 DIAGNOSIS — R209 Unspecified disturbances of skin sensation: Secondary | ICD-10-CM

## 2015-03-09 DIAGNOSIS — R208 Other disturbances of skin sensation: Secondary | ICD-10-CM

## 2015-03-09 DIAGNOSIS — R2689 Other abnormalities of gait and mobility: Secondary | ICD-10-CM | POA: Diagnosis not present

## 2015-03-09 DIAGNOSIS — R29818 Other symptoms and signs involving the nervous system: Secondary | ICD-10-CM

## 2015-03-09 MED ORDER — TRIAMCINOLONE ACETONIDE 0.1 % EX CREA: 1.0000 "application " | TOPICAL_CREAM | Freq: Two times a day (BID) | CUTANEOUS | Status: DC

## 2015-03-09 MED FILL — TRIAMCINOLONE 0.1% CREAM: 0.1 | 20 days supply | Qty: 30 | Fill #0

## 2015-03-09 NOTE — Addendum Note (Signed)
Addended by: Fredderick Severance on: 03/09/2015 04:39 PM   Modules accepted: Orders
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
## 2015-05-11 ENCOUNTER — Other Ambulatory Visit: Payer: Self-pay | Admitting: Neurology

## 2015-05-11 ENCOUNTER — Other Ambulatory Visit: Payer: Self-pay | Admitting: Oncology

## 2015-05-11 DIAGNOSIS — R200 Anesthesia of skin: Secondary | ICD-10-CM

## 2015-05-11 DIAGNOSIS — R531 Weakness: Secondary | ICD-10-CM

## 2015-05-11 DIAGNOSIS — G35 Multiple sclerosis: Secondary | ICD-10-CM

## 2015-05-18 DIAGNOSIS — M6281 Muscle weakness (generalized): Secondary | ICD-10-CM | POA: Diagnosis not present

## 2015-05-18 DIAGNOSIS — R200 Anesthesia of skin: Secondary | ICD-10-CM | POA: Diagnosis not present

## 2015-05-18 DIAGNOSIS — Z01812 Encounter for preprocedural laboratory examination: Secondary | ICD-10-CM | POA: Diagnosis not present

## 2015-05-18 DIAGNOSIS — R202 Paresthesia of skin: Secondary | ICD-10-CM | POA: Diagnosis not present

## 2015-05-21 DIAGNOSIS — M4606 Spinal enthesopathy, lumbar region: Secondary | ICD-10-CM | POA: Diagnosis not present

## 2015-05-21 DIAGNOSIS — M531 Cervicobrachial syndrome: Secondary | ICD-10-CM | POA: Diagnosis not present

## 2015-05-21 DIAGNOSIS — M4608 Spinal enthesopathy, sacral and sacrococcygeal region: Secondary | ICD-10-CM | POA: Diagnosis not present

## 2015-05-21 DIAGNOSIS — G5601 Carpal tunnel syndrome, right upper limb: Secondary | ICD-10-CM | POA: Diagnosis not present

## 2015-05-21 DIAGNOSIS — M9901 Segmental and somatic dysfunction of cervical region: Secondary | ICD-10-CM | POA: Diagnosis not present

## 2015-05-21 DIAGNOSIS — M4602 Spinal enthesopathy, cervical region: Secondary | ICD-10-CM | POA: Diagnosis not present

## 2015-05-21 DIAGNOSIS — G5602 Carpal tunnel syndrome, left upper limb: Secondary | ICD-10-CM | POA: Diagnosis not present

## 2015-05-24 ENCOUNTER — Ambulatory Visit
Admission: RE | Admit: 2015-05-24 | Discharge: 2015-05-24 | Disposition: A | Discharge status: Home / Self Care | Payer: BLUE CROSS/BLUE SHIELD | Source: Ambulatory Visit | Attending: Neurology | Admitting: Neurology

## 2015-05-24 DIAGNOSIS — R531 Weakness: Secondary | ICD-10-CM

## 2015-05-24 DIAGNOSIS — R200 Anesthesia of skin: Secondary | ICD-10-CM | POA: Insufficient documentation

## 2015-05-24 DIAGNOSIS — G35 Multiple sclerosis: Secondary | ICD-10-CM | POA: Diagnosis not present

## 2015-05-24 LAB — CSF CELL COUNT WITH DIFFERENTIAL
EOS CSF: 0 %
Lymphs, CSF: 88 %
Monocyte-Macrophage-Spinal Fluid: 8 %
Other Cells, CSF: 0
RBC COUNT CSF: 226 /mm3 — AB (ref 0–3)
SEGMENTED NEUTROPHILS-CSF: 4 %
Tube #: 2
WBC CSF: 11 /mm3

## 2015-05-24 LAB — CBC
HCT: 39.5 % (ref 35.0–47.0)
HEMOGLOBIN: 13.6 g/dL (ref 12.0–16.0)
MCH: 30.8 pg (ref 26.0–34.0)
MCHC: 34.5 g/dL (ref 32.0–36.0)
MCV: 89.4 fL (ref 80.0–100.0)
Platelets: 248 10*3/uL (ref 150–440)
RBC: 4.42 MIL/uL (ref 3.80–5.20)
RDW: 13.4 % (ref 11.5–14.5)
WBC: 8.3 10*3/uL (ref 3.6–11.0)

## 2015-05-24 LAB — PREGNANCY, URINE: Preg Test, Ur: NEGATIVE

## 2015-05-24 LAB — GLUCOSE, RANDOM: GLUCOSE: 100 mg/dL — AB (ref 65–99)

## 2015-05-24 LAB — APTT: aPTT: <24 seconds — ABNORMAL LOW (ref 24–36)

## 2015-05-24 LAB — PROTEIN, CSF: Total  Protein, CSF: 28 mg/dL (ref 15–45)

## 2015-05-24 LAB — GLUCOSE, CSF: GLUCOSE CSF: 65 mg/dL (ref 40–70)

## 2015-05-24 LAB — PROTIME-INR
INR: 0.95
PROTHROMBIN TIME: 12.9 s (ref 11.4–15.0)

## 2015-05-24 MED ORDER — ACETAMINOPHEN 500 MG PO TABS
1000.0000 mg | ORAL_TABLET | Freq: Four times a day (QID) | ORAL | Status: DC | PRN
  Filled 2015-05-24: qty 2

## 2015-05-24 MED ORDER — LIDOCAINE HCL (PF) 1 % IJ SOLN
5.0000 mL | Freq: Once | INTRAMUSCULAR | Status: AC
Start: 2015-05-24 — End: 2015-05-24
  Administered 2015-05-24: 5 mL
  Filled 2015-05-24: qty 5

## 2015-05-25 LAB — ANGIOTENSIN CONVERTING ENZYME: ANGIOTENSIN-CONVERTING ENZYME: 25 U/L (ref 14–82)

## 2015-05-27 LAB — CSF IGG: IGG CSF: 1.7 mg/dL (ref 0.0–8.6)

## 2015-05-27 LAB — MYELIN BASIC PROTEIN, CSF: Myelin Basic Protein: 1.7 ng/mL — ABNORMAL HIGH (ref 0.0–1.2)

## 2015-05-27 LAB — OLIGOCLONAL BANDS, CSF + SERM

## 2015-06-01 DIAGNOSIS — G5601 Carpal tunnel syndrome, right upper limb: Secondary | ICD-10-CM | POA: Diagnosis not present

## 2015-06-01 DIAGNOSIS — M4602 Spinal enthesopathy, cervical region: Secondary | ICD-10-CM | POA: Diagnosis not present

## 2015-06-01 DIAGNOSIS — M531 Cervicobrachial syndrome: Secondary | ICD-10-CM | POA: Diagnosis not present

## 2015-06-01 DIAGNOSIS — G5602 Carpal tunnel syndrome, left upper limb: Secondary | ICD-10-CM | POA: Diagnosis not present

## 2015-06-01 DIAGNOSIS — M4606 Spinal enthesopathy, lumbar region: Secondary | ICD-10-CM | POA: Diagnosis not present

## 2015-06-01 DIAGNOSIS — M9901 Segmental and somatic dysfunction of cervical region: Secondary | ICD-10-CM | POA: Diagnosis not present

## 2015-06-16 DIAGNOSIS — M531 Cervicobrachial syndrome: Secondary | ICD-10-CM | POA: Diagnosis not present

## 2015-06-16 DIAGNOSIS — M4606 Spinal enthesopathy, lumbar region: Secondary | ICD-10-CM | POA: Diagnosis not present

## 2015-06-16 DIAGNOSIS — M4602 Spinal enthesopathy, cervical region: Secondary | ICD-10-CM | POA: Diagnosis not present

## 2015-06-16 DIAGNOSIS — M9901 Segmental and somatic dysfunction of cervical region: Secondary | ICD-10-CM | POA: Diagnosis not present

## 2015-06-16 DIAGNOSIS — M4608 Spinal enthesopathy, sacral and sacrococcygeal region: Secondary | ICD-10-CM | POA: Diagnosis not present

## 2015-06-16 DIAGNOSIS — G5602 Carpal tunnel syndrome, left upper limb: Secondary | ICD-10-CM | POA: Diagnosis not present

## 2015-06-16 DIAGNOSIS — G5601 Carpal tunnel syndrome, right upper limb: Secondary | ICD-10-CM | POA: Diagnosis not present

## 2015-06-22 DIAGNOSIS — F4323 Adjustment disorder with mixed anxiety and depressed mood: Secondary | ICD-10-CM | POA: Diagnosis not present

## 2015-06-25 DIAGNOSIS — M4602 Spinal enthesopathy, cervical region: Secondary | ICD-10-CM | POA: Diagnosis not present

## 2015-06-25 DIAGNOSIS — M4606 Spinal enthesopathy, lumbar region: Secondary | ICD-10-CM | POA: Diagnosis not present

## 2015-06-25 DIAGNOSIS — G5601 Carpal tunnel syndrome, right upper limb: Secondary | ICD-10-CM | POA: Diagnosis not present

## 2015-06-25 DIAGNOSIS — Z01419 Encounter for gynecological examination (general) (routine) without abnormal findings: Secondary | ICD-10-CM | POA: Diagnosis not present

## 2015-06-25 DIAGNOSIS — G5602 Carpal tunnel syndrome, left upper limb: Secondary | ICD-10-CM | POA: Diagnosis not present

## 2015-06-25 DIAGNOSIS — M531 Cervicobrachial syndrome: Secondary | ICD-10-CM | POA: Diagnosis not present

## 2015-06-25 DIAGNOSIS — M4608 Spinal enthesopathy, sacral and sacrococcygeal region: Secondary | ICD-10-CM | POA: Diagnosis not present

## 2015-06-25 DIAGNOSIS — M9901 Segmental and somatic dysfunction of cervical region: Secondary | ICD-10-CM | POA: Diagnosis not present

## 2015-06-28 DIAGNOSIS — M4602 Spinal enthesopathy, cervical region: Secondary | ICD-10-CM | POA: Diagnosis not present

## 2015-06-28 DIAGNOSIS — M4606 Spinal enthesopathy, lumbar region: Secondary | ICD-10-CM | POA: Diagnosis not present

## 2015-06-28 DIAGNOSIS — M531 Cervicobrachial syndrome: Secondary | ICD-10-CM | POA: Diagnosis not present

## 2015-06-28 DIAGNOSIS — M9901 Segmental and somatic dysfunction of cervical region: Secondary | ICD-10-CM | POA: Diagnosis not present

## 2015-06-28 DIAGNOSIS — G5602 Carpal tunnel syndrome, left upper limb: Secondary | ICD-10-CM | POA: Diagnosis not present

## 2015-06-28 DIAGNOSIS — G5601 Carpal tunnel syndrome, right upper limb: Secondary | ICD-10-CM | POA: Diagnosis not present

## 2015-06-28 DIAGNOSIS — M4608 Spinal enthesopathy, sacral and sacrococcygeal region: Secondary | ICD-10-CM | POA: Diagnosis not present

## 2015-06-30 DIAGNOSIS — M4602 Spinal enthesopathy, cervical region: Secondary | ICD-10-CM | POA: Diagnosis not present

## 2015-06-30 DIAGNOSIS — M531 Cervicobrachial syndrome: Secondary | ICD-10-CM | POA: Diagnosis not present

## 2015-06-30 DIAGNOSIS — G5602 Carpal tunnel syndrome, left upper limb: Secondary | ICD-10-CM | POA: Diagnosis not present

## 2015-06-30 DIAGNOSIS — M4606 Spinal enthesopathy, lumbar region: Secondary | ICD-10-CM | POA: Diagnosis not present

## 2015-06-30 DIAGNOSIS — M9901 Segmental and somatic dysfunction of cervical region: Secondary | ICD-10-CM | POA: Diagnosis not present

## 2015-06-30 DIAGNOSIS — G5601 Carpal tunnel syndrome, right upper limb: Secondary | ICD-10-CM | POA: Diagnosis not present

## 2015-06-30 DIAGNOSIS — M4608 Spinal enthesopathy, sacral and sacrococcygeal region: Secondary | ICD-10-CM | POA: Diagnosis not present

## 2015-07-06 DIAGNOSIS — G5601 Carpal tunnel syndrome, right upper limb: Secondary | ICD-10-CM | POA: Diagnosis not present

## 2015-07-06 DIAGNOSIS — M531 Cervicobrachial syndrome: Secondary | ICD-10-CM | POA: Diagnosis not present

## 2015-07-06 DIAGNOSIS — M4606 Spinal enthesopathy, lumbar region: Secondary | ICD-10-CM | POA: Diagnosis not present

## 2015-07-06 DIAGNOSIS — M9901 Segmental and somatic dysfunction of cervical region: Secondary | ICD-10-CM | POA: Diagnosis not present

## 2015-07-06 DIAGNOSIS — G5602 Carpal tunnel syndrome, left upper limb: Secondary | ICD-10-CM | POA: Diagnosis not present

## 2015-07-06 DIAGNOSIS — M4608 Spinal enthesopathy, sacral and sacrococcygeal region: Secondary | ICD-10-CM | POA: Diagnosis not present

## 2015-07-06 DIAGNOSIS — M9904 Segmental and somatic dysfunction of sacral region: Secondary | ICD-10-CM | POA: Diagnosis not present

## 2015-07-06 DIAGNOSIS — M4602 Spinal enthesopathy, cervical region: Secondary | ICD-10-CM | POA: Diagnosis not present

## 2015-07-06 DIAGNOSIS — M9903 Segmental and somatic dysfunction of lumbar region: Secondary | ICD-10-CM | POA: Diagnosis not present

## 2015-07-13 DIAGNOSIS — M9903 Segmental and somatic dysfunction of lumbar region: Secondary | ICD-10-CM | POA: Diagnosis not present

## 2015-07-13 DIAGNOSIS — M4608 Spinal enthesopathy, sacral and sacrococcygeal region: Secondary | ICD-10-CM | POA: Diagnosis not present

## 2015-07-13 DIAGNOSIS — M4602 Spinal enthesopathy, cervical region: Secondary | ICD-10-CM | POA: Diagnosis not present

## 2015-07-13 DIAGNOSIS — G5601 Carpal tunnel syndrome, right upper limb: Secondary | ICD-10-CM | POA: Diagnosis not present

## 2015-07-13 DIAGNOSIS — M9901 Segmental and somatic dysfunction of cervical region: Secondary | ICD-10-CM | POA: Diagnosis not present

## 2015-07-13 DIAGNOSIS — M4606 Spinal enthesopathy, lumbar region: Secondary | ICD-10-CM | POA: Diagnosis not present

## 2015-07-13 DIAGNOSIS — G5602 Carpal tunnel syndrome, left upper limb: Secondary | ICD-10-CM | POA: Diagnosis not present

## 2015-07-13 DIAGNOSIS — M531 Cervicobrachial syndrome: Secondary | ICD-10-CM | POA: Diagnosis not present

## 2015-07-13 DIAGNOSIS — M9904 Segmental and somatic dysfunction of sacral region: Secondary | ICD-10-CM | POA: Diagnosis not present

## 2015-07-13 MED FILL — SERTRALINE HCL 100 MG TAB: 100 | 30 days supply | Qty: 30 | Fill #2

## 2015-07-14 DIAGNOSIS — G5602 Carpal tunnel syndrome, left upper limb: Secondary | ICD-10-CM | POA: Diagnosis not present

## 2015-07-14 DIAGNOSIS — G5601 Carpal tunnel syndrome, right upper limb: Secondary | ICD-10-CM | POA: Diagnosis not present

## 2015-07-14 DIAGNOSIS — M9904 Segmental and somatic dysfunction of sacral region: Secondary | ICD-10-CM | POA: Diagnosis not present

## 2015-07-14 DIAGNOSIS — M531 Cervicobrachial syndrome: Secondary | ICD-10-CM | POA: Diagnosis not present

## 2015-07-14 DIAGNOSIS — M9901 Segmental and somatic dysfunction of cervical region: Secondary | ICD-10-CM | POA: Diagnosis not present

## 2015-07-14 DIAGNOSIS — M9903 Segmental and somatic dysfunction of lumbar region: Secondary | ICD-10-CM | POA: Diagnosis not present

## 2015-07-14 DIAGNOSIS — M4608 Spinal enthesopathy, sacral and sacrococcygeal region: Secondary | ICD-10-CM | POA: Diagnosis not present

## 2015-07-14 DIAGNOSIS — M4606 Spinal enthesopathy, lumbar region: Secondary | ICD-10-CM | POA: Diagnosis not present

## 2015-07-14 DIAGNOSIS — M4602 Spinal enthesopathy, cervical region: Secondary | ICD-10-CM | POA: Diagnosis not present

## 2015-07-27 DIAGNOSIS — G5601 Carpal tunnel syndrome, right upper limb: Secondary | ICD-10-CM | POA: Diagnosis not present

## 2015-07-27 DIAGNOSIS — F4323 Adjustment disorder with mixed anxiety and depressed mood: Secondary | ICD-10-CM | POA: Diagnosis not present

## 2015-07-27 DIAGNOSIS — M9903 Segmental and somatic dysfunction of lumbar region: Secondary | ICD-10-CM | POA: Diagnosis not present

## 2015-07-27 DIAGNOSIS — M531 Cervicobrachial syndrome: Secondary | ICD-10-CM | POA: Diagnosis not present

## 2015-07-27 DIAGNOSIS — M9901 Segmental and somatic dysfunction of cervical region: Secondary | ICD-10-CM | POA: Diagnosis not present

## 2015-07-27 DIAGNOSIS — G5602 Carpal tunnel syndrome, left upper limb: Secondary | ICD-10-CM | POA: Diagnosis not present

## 2015-07-27 DIAGNOSIS — M4608 Spinal enthesopathy, sacral and sacrococcygeal region: Secondary | ICD-10-CM | POA: Diagnosis not present

## 2015-07-27 DIAGNOSIS — M9904 Segmental and somatic dysfunction of sacral region: Secondary | ICD-10-CM | POA: Diagnosis not present

## 2015-07-27 DIAGNOSIS — M4606 Spinal enthesopathy, lumbar region: Secondary | ICD-10-CM | POA: Diagnosis not present

## 2015-07-27 DIAGNOSIS — M4602 Spinal enthesopathy, cervical region: Secondary | ICD-10-CM | POA: Diagnosis not present

## 2015-07-29 DIAGNOSIS — M4608 Spinal enthesopathy, sacral and sacrococcygeal region: Secondary | ICD-10-CM | POA: Diagnosis not present

## 2015-07-29 DIAGNOSIS — M531 Cervicobrachial syndrome: Secondary | ICD-10-CM | POA: Diagnosis not present

## 2015-07-29 DIAGNOSIS — M9904 Segmental and somatic dysfunction of sacral region: Secondary | ICD-10-CM | POA: Diagnosis not present

## 2015-07-29 DIAGNOSIS — M9901 Segmental and somatic dysfunction of cervical region: Secondary | ICD-10-CM | POA: Diagnosis not present

## 2015-07-29 DIAGNOSIS — G5601 Carpal tunnel syndrome, right upper limb: Secondary | ICD-10-CM | POA: Diagnosis not present

## 2015-07-29 DIAGNOSIS — M9903 Segmental and somatic dysfunction of lumbar region: Secondary | ICD-10-CM | POA: Diagnosis not present

## 2015-07-29 DIAGNOSIS — M4602 Spinal enthesopathy, cervical region: Secondary | ICD-10-CM | POA: Diagnosis not present

## 2015-07-29 DIAGNOSIS — M4606 Spinal enthesopathy, lumbar region: Secondary | ICD-10-CM | POA: Diagnosis not present

## 2015-07-29 DIAGNOSIS — G5602 Carpal tunnel syndrome, left upper limb: Secondary | ICD-10-CM | POA: Diagnosis not present

## 2015-08-02 DIAGNOSIS — M4602 Spinal enthesopathy, cervical region: Secondary | ICD-10-CM | POA: Diagnosis not present

## 2015-08-02 DIAGNOSIS — M4608 Spinal enthesopathy, sacral and sacrococcygeal region: Secondary | ICD-10-CM | POA: Diagnosis not present

## 2015-08-02 DIAGNOSIS — M531 Cervicobrachial syndrome: Secondary | ICD-10-CM | POA: Diagnosis not present

## 2015-08-02 DIAGNOSIS — M9904 Segmental and somatic dysfunction of sacral region: Secondary | ICD-10-CM | POA: Diagnosis not present

## 2015-08-02 DIAGNOSIS — M4606 Spinal enthesopathy, lumbar region: Secondary | ICD-10-CM | POA: Diagnosis not present

## 2015-08-02 DIAGNOSIS — G5602 Carpal tunnel syndrome, left upper limb: Secondary | ICD-10-CM | POA: Diagnosis not present

## 2015-08-02 DIAGNOSIS — M9903 Segmental and somatic dysfunction of lumbar region: Secondary | ICD-10-CM | POA: Diagnosis not present

## 2015-08-02 DIAGNOSIS — G5601 Carpal tunnel syndrome, right upper limb: Secondary | ICD-10-CM | POA: Diagnosis not present

## 2015-08-02 DIAGNOSIS — M9901 Segmental and somatic dysfunction of cervical region: Secondary | ICD-10-CM | POA: Diagnosis not present

## 2015-08-04 DIAGNOSIS — M9904 Segmental and somatic dysfunction of sacral region: Secondary | ICD-10-CM | POA: Diagnosis not present

## 2015-08-04 DIAGNOSIS — M9901 Segmental and somatic dysfunction of cervical region: Secondary | ICD-10-CM | POA: Diagnosis not present

## 2015-08-04 DIAGNOSIS — M4606 Spinal enthesopathy, lumbar region: Secondary | ICD-10-CM | POA: Diagnosis not present

## 2015-08-04 DIAGNOSIS — G5602 Carpal tunnel syndrome, left upper limb: Secondary | ICD-10-CM | POA: Diagnosis not present

## 2015-08-04 DIAGNOSIS — M531 Cervicobrachial syndrome: Secondary | ICD-10-CM | POA: Diagnosis not present

## 2015-08-04 DIAGNOSIS — G5601 Carpal tunnel syndrome, right upper limb: Secondary | ICD-10-CM | POA: Diagnosis not present

## 2015-08-04 DIAGNOSIS — M9903 Segmental and somatic dysfunction of lumbar region: Secondary | ICD-10-CM | POA: Diagnosis not present

## 2015-08-04 DIAGNOSIS — M4602 Spinal enthesopathy, cervical region: Secondary | ICD-10-CM | POA: Diagnosis not present

## 2015-08-04 DIAGNOSIS — M4608 Spinal enthesopathy, sacral and sacrococcygeal region: Secondary | ICD-10-CM | POA: Diagnosis not present

## 2015-08-17 DIAGNOSIS — F4323 Adjustment disorder with mixed anxiety and depressed mood: Secondary | ICD-10-CM | POA: Diagnosis not present

## 2015-08-18 DIAGNOSIS — M4606 Spinal enthesopathy, lumbar region: Secondary | ICD-10-CM | POA: Diagnosis not present

## 2015-08-18 DIAGNOSIS — G5601 Carpal tunnel syndrome, right upper limb: Secondary | ICD-10-CM | POA: Diagnosis not present

## 2015-08-18 DIAGNOSIS — M4602 Spinal enthesopathy, cervical region: Secondary | ICD-10-CM | POA: Diagnosis not present

## 2015-08-18 DIAGNOSIS — M4608 Spinal enthesopathy, sacral and sacrococcygeal region: Secondary | ICD-10-CM | POA: Diagnosis not present

## 2015-08-18 DIAGNOSIS — M9901 Segmental and somatic dysfunction of cervical region: Secondary | ICD-10-CM | POA: Diagnosis not present

## 2015-08-18 DIAGNOSIS — M9903 Segmental and somatic dysfunction of lumbar region: Secondary | ICD-10-CM | POA: Diagnosis not present

## 2015-08-18 DIAGNOSIS — M531 Cervicobrachial syndrome: Secondary | ICD-10-CM | POA: Diagnosis not present

## 2015-08-18 DIAGNOSIS — G5602 Carpal tunnel syndrome, left upper limb: Secondary | ICD-10-CM | POA: Diagnosis not present

## 2015-08-18 DIAGNOSIS — M9904 Segmental and somatic dysfunction of sacral region: Secondary | ICD-10-CM | POA: Diagnosis not present

## 2015-08-31 DIAGNOSIS — F4323 Adjustment disorder with mixed anxiety and depressed mood: Secondary | ICD-10-CM | POA: Diagnosis not present

## 2015-09-03 DIAGNOSIS — M9907 Segmental and somatic dysfunction of upper extremity: Secondary | ICD-10-CM | POA: Diagnosis not present

## 2015-09-03 DIAGNOSIS — M4602 Spinal enthesopathy, cervical region: Secondary | ICD-10-CM | POA: Diagnosis not present

## 2015-09-03 DIAGNOSIS — M62830 Muscle spasm of back: Secondary | ICD-10-CM | POA: Diagnosis not present

## 2015-09-03 DIAGNOSIS — M9901 Segmental and somatic dysfunction of cervical region: Secondary | ICD-10-CM | POA: Diagnosis not present

## 2015-09-08 DIAGNOSIS — M4602 Spinal enthesopathy, cervical region: Secondary | ICD-10-CM | POA: Diagnosis not present

## 2015-09-08 DIAGNOSIS — M9907 Segmental and somatic dysfunction of upper extremity: Secondary | ICD-10-CM | POA: Diagnosis not present

## 2015-09-08 DIAGNOSIS — M62830 Muscle spasm of back: Secondary | ICD-10-CM | POA: Diagnosis not present

## 2015-09-08 DIAGNOSIS — M9901 Segmental and somatic dysfunction of cervical region: Secondary | ICD-10-CM | POA: Diagnosis not present

## 2015-09-14 DIAGNOSIS — F4323 Adjustment disorder with mixed anxiety and depressed mood: Secondary | ICD-10-CM | POA: Diagnosis not present

## 2015-09-17 DIAGNOSIS — M9907 Segmental and somatic dysfunction of upper extremity: Secondary | ICD-10-CM | POA: Diagnosis not present

## 2015-09-17 DIAGNOSIS — M9901 Segmental and somatic dysfunction of cervical region: Secondary | ICD-10-CM | POA: Diagnosis not present

## 2015-09-17 DIAGNOSIS — M62830 Muscle spasm of back: Secondary | ICD-10-CM | POA: Diagnosis not present

## 2015-09-17 DIAGNOSIS — M4602 Spinal enthesopathy, cervical region: Secondary | ICD-10-CM | POA: Diagnosis not present

## 2015-10-04 DIAGNOSIS — M62830 Muscle spasm of back: Secondary | ICD-10-CM | POA: Diagnosis not present

## 2015-10-04 DIAGNOSIS — M4602 Spinal enthesopathy, cervical region: Secondary | ICD-10-CM | POA: Diagnosis not present

## 2015-10-04 DIAGNOSIS — M9901 Segmental and somatic dysfunction of cervical region: Secondary | ICD-10-CM | POA: Diagnosis not present

## 2015-10-04 DIAGNOSIS — M9907 Segmental and somatic dysfunction of upper extremity: Secondary | ICD-10-CM | POA: Diagnosis not present

## 2015-10-21 DIAGNOSIS — M9907 Segmental and somatic dysfunction of upper extremity: Secondary | ICD-10-CM | POA: Diagnosis not present

## 2015-10-21 DIAGNOSIS — M9901 Segmental and somatic dysfunction of cervical region: Secondary | ICD-10-CM | POA: Diagnosis not present

## 2015-10-21 DIAGNOSIS — M4602 Spinal enthesopathy, cervical region: Secondary | ICD-10-CM | POA: Diagnosis not present

## 2015-10-21 DIAGNOSIS — M791 Myalgia: Secondary | ICD-10-CM | POA: Diagnosis not present

## 2015-10-26 DIAGNOSIS — M791 Myalgia: Secondary | ICD-10-CM | POA: Diagnosis not present

## 2015-10-26 DIAGNOSIS — M4602 Spinal enthesopathy, cervical region: Secondary | ICD-10-CM | POA: Diagnosis not present

## 2015-10-26 DIAGNOSIS — F4323 Adjustment disorder with mixed anxiety and depressed mood: Secondary | ICD-10-CM | POA: Diagnosis not present

## 2015-10-26 DIAGNOSIS — M9901 Segmental and somatic dysfunction of cervical region: Secondary | ICD-10-CM | POA: Diagnosis not present

## 2015-10-26 DIAGNOSIS — M9907 Segmental and somatic dysfunction of upper extremity: Secondary | ICD-10-CM | POA: Diagnosis not present

## 2015-11-02 DIAGNOSIS — M9901 Segmental and somatic dysfunction of cervical region: Secondary | ICD-10-CM | POA: Diagnosis not present

## 2015-11-02 DIAGNOSIS — M791 Myalgia: Secondary | ICD-10-CM | POA: Diagnosis not present

## 2015-11-02 DIAGNOSIS — M9907 Segmental and somatic dysfunction of upper extremity: Secondary | ICD-10-CM | POA: Diagnosis not present

## 2015-11-02 DIAGNOSIS — M4602 Spinal enthesopathy, cervical region: Secondary | ICD-10-CM | POA: Diagnosis not present

## 2015-11-10 DIAGNOSIS — M4602 Spinal enthesopathy, cervical region: Secondary | ICD-10-CM | POA: Diagnosis not present

## 2015-11-10 DIAGNOSIS — M791 Myalgia: Secondary | ICD-10-CM | POA: Diagnosis not present

## 2015-11-10 DIAGNOSIS — M9901 Segmental and somatic dysfunction of cervical region: Secondary | ICD-10-CM | POA: Diagnosis not present

## 2015-11-10 DIAGNOSIS — M9907 Segmental and somatic dysfunction of upper extremity: Secondary | ICD-10-CM | POA: Diagnosis not present

## 2015-11-15 DIAGNOSIS — M4602 Spinal enthesopathy, cervical region: Secondary | ICD-10-CM | POA: Diagnosis not present

## 2015-11-15 DIAGNOSIS — M9907 Segmental and somatic dysfunction of upper extremity: Secondary | ICD-10-CM | POA: Diagnosis not present

## 2015-11-15 DIAGNOSIS — M791 Myalgia: Secondary | ICD-10-CM | POA: Diagnosis not present

## 2015-11-15 DIAGNOSIS — M9901 Segmental and somatic dysfunction of cervical region: Secondary | ICD-10-CM | POA: Diagnosis not present

## 2015-11-23 DIAGNOSIS — M9901 Segmental and somatic dysfunction of cervical region: Secondary | ICD-10-CM | POA: Diagnosis not present

## 2015-11-23 DIAGNOSIS — M4602 Spinal enthesopathy, cervical region: Secondary | ICD-10-CM | POA: Diagnosis not present

## 2015-11-23 DIAGNOSIS — M9907 Segmental and somatic dysfunction of upper extremity: Secondary | ICD-10-CM | POA: Diagnosis not present

## 2015-11-23 DIAGNOSIS — M791 Myalgia: Secondary | ICD-10-CM | POA: Diagnosis not present

## 2015-11-29 DIAGNOSIS — M791 Myalgia: Secondary | ICD-10-CM | POA: Diagnosis not present

## 2015-11-29 DIAGNOSIS — M9907 Segmental and somatic dysfunction of upper extremity: Secondary | ICD-10-CM | POA: Diagnosis not present

## 2015-11-29 DIAGNOSIS — M4602 Spinal enthesopathy, cervical region: Secondary | ICD-10-CM | POA: Diagnosis not present

## 2015-11-29 DIAGNOSIS — M9901 Segmental and somatic dysfunction of cervical region: Secondary | ICD-10-CM | POA: Diagnosis not present

## 2015-12-02 ENCOUNTER — Other Ambulatory Visit: Payer: Self-pay | Admitting: Family Medicine

## 2015-12-02 DIAGNOSIS — Z1239 Encounter for other screening for malignant neoplasm of breast: Secondary | ICD-10-CM

## 2015-12-07 DIAGNOSIS — M9907 Segmental and somatic dysfunction of upper extremity: Secondary | ICD-10-CM | POA: Diagnosis not present

## 2015-12-07 DIAGNOSIS — M791 Myalgia: Secondary | ICD-10-CM | POA: Diagnosis not present

## 2015-12-07 DIAGNOSIS — M9901 Segmental and somatic dysfunction of cervical region: Secondary | ICD-10-CM | POA: Diagnosis not present

## 2015-12-07 DIAGNOSIS — M4602 Spinal enthesopathy, cervical region: Secondary | ICD-10-CM | POA: Diagnosis not present

## 2015-12-15 DIAGNOSIS — M9907 Segmental and somatic dysfunction of upper extremity: Secondary | ICD-10-CM | POA: Diagnosis not present

## 2015-12-15 DIAGNOSIS — M791 Myalgia: Secondary | ICD-10-CM | POA: Diagnosis not present

## 2015-12-15 DIAGNOSIS — M4602 Spinal enthesopathy, cervical region: Secondary | ICD-10-CM | POA: Diagnosis not present

## 2015-12-15 DIAGNOSIS — M9901 Segmental and somatic dysfunction of cervical region: Secondary | ICD-10-CM | POA: Diagnosis not present

## 2015-12-28 DIAGNOSIS — F4323 Adjustment disorder with mixed anxiety and depressed mood: Secondary | ICD-10-CM | POA: Diagnosis not present

## 2016-01-11 ENCOUNTER — Ambulatory Visit (HOSPITAL_BASED_OUTPATIENT_CLINIC_OR_DEPARTMENT_OTHER)
Admission: RE | Admit: 2016-01-11 | Discharge: 2016-01-11 | Disposition: A | Discharge status: Home / Self Care | Payer: BLUE CROSS/BLUE SHIELD | Source: Ambulatory Visit | Attending: Family Medicine | Admitting: Family Medicine

## 2016-01-11 DIAGNOSIS — Z1239 Encounter for other screening for malignant neoplasm of breast: Secondary | ICD-10-CM

## 2016-01-11 DIAGNOSIS — Z1231 Encounter for screening mammogram for malignant neoplasm of breast: Secondary | ICD-10-CM | POA: Diagnosis not present

## 2016-01-18 DIAGNOSIS — F4323 Adjustment disorder with mixed anxiety and depressed mood: Secondary | ICD-10-CM | POA: Diagnosis not present

## 2016-02-01 DIAGNOSIS — F4323 Adjustment disorder with mixed anxiety and depressed mood: Secondary | ICD-10-CM | POA: Diagnosis not present

## 2016-02-18 DIAGNOSIS — J069 Acute upper respiratory infection, unspecified: Secondary | ICD-10-CM | POA: Diagnosis not present

## 2016-02-18 DIAGNOSIS — R0602 Shortness of breath: Secondary | ICD-10-CM | POA: Diagnosis not present

## 2016-02-22 DIAGNOSIS — M7542 Impingement syndrome of left shoulder: Secondary | ICD-10-CM | POA: Diagnosis not present

## 2016-02-22 DIAGNOSIS — F4323 Adjustment disorder with mixed anxiety and depressed mood: Secondary | ICD-10-CM | POA: Diagnosis not present

## 2016-02-22 DIAGNOSIS — M25512 Pain in left shoulder: Secondary | ICD-10-CM | POA: Diagnosis not present

## 2016-03-07 DIAGNOSIS — F4323 Adjustment disorder with mixed anxiety and depressed mood: Secondary | ICD-10-CM | POA: Diagnosis not present

## 2016-03-21 DIAGNOSIS — F4323 Adjustment disorder with mixed anxiety and depressed mood: Secondary | ICD-10-CM | POA: Diagnosis not present

## 2016-04-11 DIAGNOSIS — M9901 Segmental and somatic dysfunction of cervical region: Secondary | ICD-10-CM | POA: Diagnosis not present

## 2016-04-11 DIAGNOSIS — F4323 Adjustment disorder with mixed anxiety and depressed mood: Secondary | ICD-10-CM | POA: Diagnosis not present

## 2016-04-11 DIAGNOSIS — M542 Cervicalgia: Secondary | ICD-10-CM | POA: Diagnosis not present

## 2016-04-11 DIAGNOSIS — M9907 Segmental and somatic dysfunction of upper extremity: Secondary | ICD-10-CM | POA: Diagnosis not present

## 2016-04-17 ENCOUNTER — Ambulatory Visit (INDEPENDENT_AMBULATORY_CARE_PROVIDER_SITE_OTHER): Payer: BLUE CROSS/BLUE SHIELD | Admitting: Family Medicine

## 2016-04-17 ENCOUNTER — Encounter: Payer: Self-pay | Admitting: Family Medicine

## 2016-04-17 VITALS — BP 120/90 | HR 80 | Ht 68.0 in | Wt 315.0 lb

## 2016-04-17 DIAGNOSIS — F339 Major depressive disorder, recurrent, unspecified: Secondary | ICD-10-CM | POA: Diagnosis not present

## 2016-04-17 DIAGNOSIS — M7592 Shoulder lesion, unspecified, left shoulder: Secondary | ICD-10-CM | POA: Diagnosis not present

## 2016-04-17 DIAGNOSIS — L309 Dermatitis, unspecified: Secondary | ICD-10-CM | POA: Diagnosis not present

## 2016-04-17 MED ORDER — SERTRALINE HCL 100 MG PO TABS: 100.0000 mg | ORAL_TABLET | Freq: Every day | ORAL | 1 refills | Status: DC

## 2016-04-17 MED ORDER — CLOBETASOL PROPIONATE 0.05 % EX CREA: 1.0000 "application " | TOPICAL_CREAM | Freq: Two times a day (BID) | CUTANEOUS | 2 refills | Status: DC

## 2016-04-17 MED ORDER — MELOXICAM 7.5 MG PO TABS: 7.5000 mg | ORAL_TABLET | Freq: Two times a day (BID) | ORAL | 1 refills | Status: DC

## 2016-04-17 MED ORDER — CRISABOROLE 2 % EX OINT: 1.0000 "application " | TOPICAL_OINTMENT | Freq: Two times a day (BID) | CUTANEOUS | 1 refills | Status: AC

## 2016-04-17 MED FILL — CLOBETASOL 0.05% CREAM: 0.05 | 15 days supply | Qty: 30 | Fill #0

## 2016-04-17 MED FILL — SERTRALINE HCL 100 MG TAB: 100 | 30 days supply | Qty: 30 | Fill #0

## 2016-04-17 MED FILL — EUCRISA 2% OINTMENT: 2 | 30 days supply | Qty: 60 | Fill #0

## 2016-04-17 NOTE — Patient Instructions (Signed)
Shoulder Impingement Syndrome Shoulder impingement syndrome is a condition that causes pain when connective tissues (tendons) surrounding the shoulder joint become pinched. These tendons are part of the group of muscles and tissues that help to stabilize the shoulder (rotator cuff). Beneath the rotator cuff is a fluid-filled sac (bursa) that allows the muscles and tendons to glide smoothly. The bursa may become swollen or irritated (bursitis). Bursitis, swelling in the rotator cuff tendons, or both conditions can decrease how much space is under a bone in the shoulder joint (acromion), resulting in impingement. What are the causes? Shoulder impingement syndrome can be caused by bursitis or swelling of the rotator cuff tendons, which may result from:  Repetitive overhead arm movements.  Falling onto the shoulder.  Weakness in the shoulder muscles. What increases the risk? You may be more likely to develop this condition if you are an athlete who participates in:  Sports that involve throwing, such as baseball.  Tennis.  Swimming.  Volleyball. Some people are also more likely to develop impingement syndrome because of the shape of their acromion bone. What are the signs or symptoms? The main symptom of this condition is pain on the front or side of the shoulder. Pain may:  Get worse when lifting or raising the arm.  Get worse at night.  Wake you up from sleeping.  Feel sharp when the shoulder is moved, and then fade to an ache. Other signs and symptoms may include:  Tenderness.  Stiffness.  Inability to raise the arm above shoulder level or behind the body.  Weakness. How is this diagnosed? This condition may be diagnosed based on:  Your symptoms.  Your medical history.  A physical exam.  Imaging tests, such as:  X-rays.  MRI.  Ultrasound. How is this treated? Treatment for this condition may include:  Resting your shoulder and avoiding all activities that  cause pain or put stress on the shoulder.  Icing your shoulder.  NSAIDs to help reduce pain and swelling.  One or more injections of medicines to numb the area and reduce inflammation.  Physical therapy.  Surgery. This may be needed if nonsurgical treatments have not helped. Surgery may involve repairing the rotator cuff, reshaping the acromion, or removing the bursa. Follow these instructions at home: Managing pain, stiffness, and swelling   If directed, apply ice to the injured area.  Put ice in a plastic bag.  Place a towel between your skin and the bag.  Leave the ice on for 20 minutes, 2-3 times a day. Activity   Rest and return to your normal activities as told by your health care provider. Ask your health care provider what activities are safe for you.  Do exercises as told by your health care provider. General instructions   Do not use any tobacco products, including cigarettes, chewing tobacco, or e-cigarettes. Tobacco can delay healing. If you need help quitting, ask your health care provider.  Ask your health care provider when it is safe for you to drive.  Take over-the-counter and prescription medicines only as told by your health care provider.  Keep all follow-up visits as told by your health care provider. This is important. How is this prevented?  Give your body time to rest between periods of activity.  Be safe and responsible while being active to avoid falls.  Maintain physical fitness, including strength and flexibility. Contact a health care provider if:  Your symptoms have not improved after 1-2 months of treatment and rest.  You   cannot lift your arm away from your body. This information is not intended to replace advice given to you by your health care provider. Make sure you discuss any questions you have with your health care provider. Document Released: 01/30/2005 Document Revised: 10/07/2015 Document Reviewed: 01/02/2015 Elsevier  Interactive Patient Education  2017 [employer]  

## 2016-04-17 NOTE — Progress Notes (Signed)
Name: Valerie Underwood   MRN: [id number]    DOB: [birth year]   Date:04/17/2016       Progress Note  Subjective  Chief Complaint  Chief Complaint  Patient presents with  . Depression    pms symptoms  . Eczema    refill clobetasole cream    Depression         This is a chronic problem.  The current episode started more than 1 year ago.   The onset quality is sudden.   The problem occurs every several days.  The problem has been waxing and waning since onset.  Associated symptoms include no decreased concentration, no fatigue, no helplessness, no hopelessness, does not have insomnia, not irritable, no restlessness, no decreased interest, no appetite change, no body aches, no myalgias, no headaches, no indigestion, not sad and no suicidal ideas.     Exacerbated by: menstrual period.  Past treatments include SSRIs - Selective serotonin reuptake inhibitors.  Compliance with treatment is good.  Previous treatment provided moderate relief. Rash  This is a chronic problem. The current episode started more than 1 year ago. The problem has been waxing and waning since onset. The affected locations include the left hand and right hand. The rash is characterized by itchiness and redness. Associated with: pollen. Pertinent negatives include no anorexia, congestion, cough, diarrhea, eye pain, facial edema, fatigue, fever, joint pain, nail changes, rhinorrhea, shortness of breath, sore throat or vomiting. Past treatments include topical steroids. The treatment provided mild relief. Her past medical history is significant for eczema.    No problem-specific Assessment & Plan notes found for this encounter.   Past Medical History:  Diagnosis Date  . Anemia   . Depression   . Eczema   . History of traumatic brain injury 1997  . Seizures ([place])    hx- after TBI, last sz 2001  . Uterine fibroid    history    Past Surgical History:  Procedure Laterality Date  . DILATION AND CURETTAGE OF UTERUS  1988  .  UTERINE ARTERY EMBOLIZATION  2009    Family History  Problem Relation Age of Onset  . Adopted: Yes  . Stroke Mother   . Hypertension Father   . Diabetes Father   . Cancer Maternal Grandmother     Social History   Social History  . Marital status: Married    Spouse name: N/A  . Number of children: N/A  . Years of education: N/A   Occupational History  . Not on file.   Social History Main Topics  . Smoking status: Never Smoker  . Smokeless tobacco: Never Used  . Alcohol use 2.4 oz/week    4 Glasses of wine per week  . Drug use: No  . Sexual activity: Yes    Birth control/ protection: None   Other Topics Concern  . Not on file   Social History Narrative  . No narrative on file    Allergies  Allergen Reactions  . Statins Other (See Comments)    Body aches  . Benadryl [Diphenhydramine] Palpitations    IV only-can take oral    Outpatient Medications Prior to Visit  Medication Sig Dispense Refill  . Multiple Vitamin (MULTIVITAMIN) tablet Take 1 tablet by mouth daily.      . sertraline (ZOLOFT) 100 MG tablet Take 1 tablet (100 mg total) by mouth daily. (Patient taking differently: Take 100 mg by mouth daily. PRN for menstrual cycle) 90 tablet 3  . triamcinolone cream (KENALOG)  0.1 % Apply 1 application topically 2 (two) times daily. 30 g 0   No facility-administered medications prior to visit.     Review of Systems  Constitutional: Negative for appetite change, chills, fatigue, fever, malaise/fatigue and weight loss.  HENT: Negative for congestion, ear discharge, ear pain, rhinorrhea and sore throat.   Eyes: Negative for blurred vision and pain.  Respiratory: Negative for cough, sputum production, shortness of breath and wheezing.   Cardiovascular: Negative for chest pain, palpitations and leg swelling.  Gastrointestinal: Negative for abdominal pain, anorexia, blood in stool, constipation, diarrhea, heartburn, melena, nausea and vomiting.  Genitourinary:  Negative for dysuria, frequency, hematuria and urgency.  Musculoskeletal: Negative for back pain, joint pain, myalgias and neck pain.  Skin: Positive for rash. Negative for nail changes.  Neurological: Negative for dizziness, tingling, sensory change, focal weakness and headaches.  Endo/Heme/Allergies: Negative for environmental allergies and polydipsia. Does not bruise/bleed easily.  Psychiatric/Behavioral: Positive for depression. Negative for decreased concentration and suicidal ideas. The patient is not nervous/anxious and does not have insomnia.      Objective  Vitals:   04/17/16 1057  BP: 120/90  Pulse: 80  Weight: (!) 315 lb (142.9 kg)  Height: 5\' 8"  (1.727 m)    Physical Exam  Constitutional: She is well-developed, well-nourished, and in no distress. She is not irritable. No distress.  HENT:  Head: Normocephalic and atraumatic.  Right Ear: External ear normal.  Left Ear: External ear normal.  Nose: Nose normal.  Mouth/Throat: Oropharynx is clear and moist.  Eyes: Conjunctivae and EOM are normal. Pupils are equal, round, and reactive to light. Right eye exhibits no discharge. Left eye exhibits no discharge.  Neck: Normal range of motion. Neck supple. No JVD present. No thyromegaly present.  Cardiovascular: Normal rate, regular rhythm, normal heart sounds and intact distal pulses.  Exam reveals no gallop and no friction rub.   No murmur heard. Pulmonary/Chest: Effort normal and breath sounds normal. She has no wheezes. She has no rales.  Abdominal: Soft. Bowel sounds are normal. She exhibits no mass. There is no tenderness. There is no guarding.  Musculoskeletal: She exhibits no edema.       Left shoulder: She exhibits decreased range of motion and tenderness.  Lymphadenopathy:    She has no cervical adenopathy.  Neurological: She is alert. She has normal reflexes.  Skin: Skin is warm and dry. She is not diaphoretic.  Psychiatric: Mood and affect normal.  Nursing note  and vitals reviewed.     Assessment & Plan  Problem List Items Addressed This Visit    None    Visit Diagnoses    Depression, recurrent ([place])    -  Primary   Relevant Medications   sertraline (ZOLOFT) 100 MG tablet   Chronic eczema of hand       partial resolution with temavate   Relevant Medications   clobetasol cream (TEMOVATE) 0.05 %   Crisaborole (EUCRISA) 2 % OINT   Left supraspinatus tendonitis       patient may call ortho for poss injection   Relevant Medications   meloxicam (MOBIC) 7.5 MG tablet      Meds ordered this encounter  Medications  . clobetasol cream (TEMOVATE) 0.05 %    Sig: Apply 1 application topically 2 (two) times daily.    Dispense:  30 g    Refill:  2  . meloxicam (MOBIC) 7.5 MG tablet    Sig: Take 1 tablet (7.5 mg total) by mouth 2 (two)  times daily.    Dispense:  60 tablet    Refill:  1  . sertraline (ZOLOFT) 100 MG tablet    Sig: Take 1 tablet (100 mg total) by mouth daily. PRN for menstrual cycle    Dispense:  90 tablet    Refill:  1  . Crisaborole (EUCRISA) 2 % OINT    Sig: Apply 1 application topically 2 (two) times daily.    Dispense:  60 g    Refill:  1      Dr. Macon [facility]  04/17/16

## 2016-04-25 ENCOUNTER — Ambulatory Visit: Payer: BLUE CROSS/BLUE SHIELD | Admitting: Family Medicine

## 2016-04-28 DIAGNOSIS — R0602 Shortness of breath: Secondary | ICD-10-CM | POA: Diagnosis not present

## 2016-04-28 DIAGNOSIS — R0789 Other chest pain: Secondary | ICD-10-CM | POA: Diagnosis not present

## 2016-04-28 DIAGNOSIS — J069 Acute upper respiratory infection, unspecified: Secondary | ICD-10-CM | POA: Diagnosis not present

## 2016-04-28 DIAGNOSIS — I4891 Unspecified atrial fibrillation: Secondary | ICD-10-CM | POA: Diagnosis not present

## 2016-04-28 DIAGNOSIS — R05 Cough: Secondary | ICD-10-CM | POA: Diagnosis not present

## 2016-04-28 DIAGNOSIS — R509 Fever, unspecified: Secondary | ICD-10-CM | POA: Diagnosis not present

## 2016-04-28 DIAGNOSIS — R062 Wheezing: Secondary | ICD-10-CM | POA: Diagnosis not present

## 2016-04-30 ENCOUNTER — Ambulatory Visit
Admission: EM | Admit: 2016-04-30 | Discharge: 2016-04-30 | Disposition: A | Discharge status: Home / Self Care | Payer: BLUE CROSS/BLUE SHIELD | Attending: Family Medicine | Admitting: Family Medicine

## 2016-04-30 DIAGNOSIS — J189 Pneumonia, unspecified organism: Secondary | ICD-10-CM

## 2016-04-30 DIAGNOSIS — J181 Lobar pneumonia, unspecified organism: Secondary | ICD-10-CM | POA: Diagnosis not present

## 2016-04-30 MED ORDER — LEVOFLOXACIN 750 MG PO TABS: 750.0000 mg | ORAL_TABLET | Freq: Every day | ORAL | 0 refills | Status: DC

## 2016-04-30 MED ORDER — LEVOFLOXACIN 500 MG PO TABS: 500.0000 mg | ORAL_TABLET | Freq: Every day | ORAL | 0 refills | Status: DC

## 2016-04-30 NOTE — ED Triage Notes (Signed)
Pt was diagnosed with pneumonia and flu like sx a week ago. Took Zpack and has not improved. Violently coughing, body aches, feels SOB. Denies fever.

## 2016-04-30 NOTE — ED Provider Notes (Signed)
CSN: [id number]     Arrival date & time 04/30/16  0801 History   First MD Initiated Contact with Patient 04/30/16 0848     Chief Complaint  Patient presents with  . Pneumonia   (Consider location/radiation/quality/duration/timing/severity/associated sxs/prior Treatment) HPI  46 year old resents with hips history of pneumonia symptoms that began a week ago. Seen at the [facility] emergency room x-rays showed that she had a pneumonia and post placed on a Z-Pak. Despite taking 3 doses so far she has not improved and is actually maybe somewhat worse. He is to have body aches of coughing violently and feels short of breath. Is any fevers. O2 sats on room air 97%. They said the cough is nonproductive.      Past Medical History:  Diagnosis Date  . Anemia   . Depression   . Eczema   . History of traumatic brain injury 1997  . Seizures ([place])    hx- after TBI, last sz 2001  . Uterine fibroid    history   Past Surgical History:  Procedure Laterality Date  . DILATION AND CURETTAGE OF UTERUS  1988  . UTERINE ARTERY EMBOLIZATION  2009   Family History  Problem Relation Age of Onset  . Adopted: Yes  . Stroke Mother   . Hypertension Father   . Diabetes Father   . Cancer Maternal Grandmother    Social History  Substance Use Topics  . Smoking status: Never Smoker  . Smokeless tobacco: Never Used  . Alcohol use 2.4 oz/week    4 Glasses of wine per week   OB History    Gravida Para Term Preterm AB Living   1       1     SAB TAB Ectopic Multiple Live Births   1             Review of Systems  Constitutional: Positive for activity change, chills and fatigue.  HENT: Positive for congestion, sinus pain and sinus pressure.   Respiratory: Positive for cough and shortness of breath. Negative for wheezing and stridor.   All other systems reviewed and are negative.   Allergies  Prednisone; Statins; and Benadryl [diphenhydramine]  Home Medications   Prior to Admission medications    Medication Sig Start Date End Date Taking? Authorizing Provider  albuterol (PROVENTIL HFA;VENTOLIN HFA) 108 (90 Base) MCG/ACT inhaler Inhale into the lungs every 6 (six) hours as needed for wheezing or shortness of breath.   Yes Historical Provider, MD  azithromycin (ZITHROMAX) 250 MG tablet Take by mouth daily.   Yes Historical Provider, MD  chlorpheniramine-HYDROcodone (TUSSIONEX PENNKINETIC ER) 10-8 MG/5ML SUER Take 5 mLs by mouth.   Yes Historical Provider, MD  dextromethorphan-guaiFENesin (MUCINEX DM) 30-600 MG 12hr tablet Take 1 tablet by mouth 2 (two) times daily.   Yes Historical Provider, MD  clobetasol cream (TEMOVATE) 0.07 % Apply 1 application topically 2 (two) times daily. 04/17/16   Juline Patch, MD  levofloxacin (LEVAQUIN) 500 MG tablet Take 1 tablet (500 mg total) by mouth daily. 04/30/16   Lorin Picket, PA-C  meloxicam (MOBIC) 7.5 MG tablet Take 1 tablet (7.5 mg total) by mouth 2 (two) times daily. 04/17/16   Juline Patch, MD  Multiple Vitamin (MULTIVITAMIN) tablet Take 1 tablet by mouth daily.      Historical Provider, MD  sertraline (ZOLOFT) 100 MG tablet Take 1 tablet (100 mg total) by mouth daily. PRN for menstrual cycle 04/17/16   Juline Patch, MD   Meds Ordered  and Administered this Visit  Medications - No data to display  BP 137/66 (BP Location: Left Arm)   Pulse 73   Temp 98.2 F (36.8 C) (Oral)   Resp 18   Ht 5\' 8"  (1.727 m)   Wt (!) 315 lb (142.9 kg)   LMP 04/03/2016   SpO2 97%   BMI 47.90 kg/m  No data found.   Physical Exam  Constitutional: She is oriented to person, place, and time. She appears well-developed and well-nourished. No distress.  HENT:  Head: Normocephalic and atraumatic.  Right Ear: External ear normal.  Left Ear: External ear normal.  Nose: Nose normal.  Mouth/Throat: Oropharynx is clear and moist. No oropharyngeal exudate.  Eyes: Pupils are equal, round, and reactive to light. Right eye exhibits no discharge. Left eye exhibits no  discharge.  Neck: Normal range of motion. Neck supple.  Pulmonary/Chest: Effort normal. No respiratory distress. She has no wheezes. She has rales.  The patient has non-tussive crackles in the right base  Musculoskeletal: Normal range of motion.  Lymphadenopathy:    She has no cervical adenopathy.  Neurological: She is alert and oriented to person, place, and time.  Skin: Skin is warm and dry. She is not diaphoretic.  Psychiatric: She has a normal mood and affect. Her behavior is normal. Judgment and thought content normal.  Nursing note and vitals reviewed.   Urgent Care Course     Procedures (including critical care time)  Labs Review Labs Reviewed - No data to display  Imaging Review No results found.   Visual Acuity Review  Right Eye Distance:   Left Eye Distance:   Bilateral Distance:    Right Eye Near:   Left Eye Near:    Bilateral Near:         MDM   1. Community acquired pneumonia of right lower lobe of lung [facility])    Discharge Medication List as of 04/30/2016  9:14 AM    START taking these medications   Details  levofloxacin (LEVAQUIN) 500 MG tablet Take 1 tablet (500 mg total) by mouth daily., Starting Sun 04/30/2016, Normal      Plan: 1. Test/x-ray results and diagnosis reviewed with patient 2. rx as per orders; risks, benefits, potential side effects reviewed with patient 3. Recommend supportive treatment with Rest and fluids. Since Z-Pak does not seem to be helping I have decided to switch over to Levaquin. Told her that it may not be working because he could be a viral pneumonia. At any rate she needs to follow-up with her primary care physician next week. Would recommend a repeat x-ray in 4-6 weeks for proof of resolution. 4. F/u prn if sym rptoms worsen or don't improve     Lorin Picket, PA-C 04/30/16 [phone]

## 2016-05-09 ENCOUNTER — Encounter: Payer: BLUE CROSS/BLUE SHIELD | Admitting: Family Medicine

## 2016-05-09 DIAGNOSIS — F4323 Adjustment disorder with mixed anxiety and depressed mood: Secondary | ICD-10-CM | POA: Diagnosis not present

## 2016-05-09 DIAGNOSIS — M542 Cervicalgia: Secondary | ICD-10-CM | POA: Diagnosis not present

## 2016-05-09 DIAGNOSIS — M9901 Segmental and somatic dysfunction of cervical region: Secondary | ICD-10-CM | POA: Diagnosis not present

## 2016-05-09 DIAGNOSIS — M9907 Segmental and somatic dysfunction of upper extremity: Secondary | ICD-10-CM | POA: Diagnosis not present

## 2016-05-17 ENCOUNTER — Other Ambulatory Visit: Payer: Self-pay | Admitting: Family Medicine

## 2016-05-17 ENCOUNTER — Ambulatory Visit (INDEPENDENT_AMBULATORY_CARE_PROVIDER_SITE_OTHER): Payer: BLUE CROSS/BLUE SHIELD | Admitting: Family Medicine

## 2016-05-17 VITALS — BP 120/88 | HR 84 | Ht 68.0 in | Wt 312.0 lb

## 2016-05-17 DIAGNOSIS — L918 Other hypertrophic disorders of the skin: Secondary | ICD-10-CM | POA: Diagnosis not present

## 2016-05-17 DIAGNOSIS — R69 Illness, unspecified: Secondary | ICD-10-CM | POA: Diagnosis not present

## 2016-05-17 DIAGNOSIS — C4490 Unspecified malignant neoplasm of skin, unspecified: Secondary | ICD-10-CM | POA: Diagnosis not present

## 2016-05-17 DIAGNOSIS — E781 Pure hyperglyceridemia: Secondary | ICD-10-CM | POA: Diagnosis not present

## 2016-05-17 DIAGNOSIS — Z6841 Body Mass Index (BMI) 40.0 and over, adult: Secondary | ICD-10-CM

## 2016-05-17 DIAGNOSIS — D485 Neoplasm of uncertain behavior of skin: Secondary | ICD-10-CM | POA: Diagnosis not present

## 2016-05-17 DIAGNOSIS — Z842 Family history of other diseases of the genitourinary system: Secondary | ICD-10-CM

## 2016-05-17 DIAGNOSIS — D492 Neoplasm of unspecified behavior of bone, soft tissue, and skin: Secondary | ICD-10-CM | POA: Diagnosis not present

## 2016-05-17 DIAGNOSIS — D500 Iron deficiency anemia secondary to blood loss (chronic): Secondary | ICD-10-CM

## 2016-05-17 NOTE — Progress Notes (Signed)
Name: Valerie Underwood   MRN: [id number]    DOB: June 02, 1970   Date:05/17/2016       Progress Note  Subjective  Chief Complaint  Chief Complaint  Patient presents with  . lab work    lipid, glucose, renal    Depression         This is a chronic (pms) problem.  The onset quality is gradual.   The problem occurs intermittently.  The most recent episode lasted 4 days.    The problem has been waxing and waning since onset.  Associated symptoms include irritable and body aches.  Associated symptoms include no decreased concentration, no fatigue, no helplessness, no hopelessness, does not have insomnia, no restlessness, no decreased interest, no appetite change, no myalgias, no headaches, no indigestion, not sad and no suicidal ideas.     The symptoms are aggravated by nothing.  Past treatments include SSRIs - Selective serotonin reuptake inhibitors (sertraline).  Compliance with treatment is good.  Previous treatment provided moderate relief.  Risk factors include menopause (PMS).    Pertinent negatives include no hypothyroidism. Hyperlipidemia  This is a new problem. The current episode started more than 1 month ago. The problem is controlled. Recent lipid tests were reviewed and are normal. Exacerbating diseases include obesity. She has no history of chronic renal disease, diabetes, hypothyroidism, liver disease or nephrotic syndrome. Pertinent negatives include no chest pain, focal weakness, myalgias or shortness of breath. She is currently on no antihyperlipidemic treatment. The current treatment provides mild improvement of lipids. There are no compliance problems.  Risk factors for coronary artery disease include dyslipidemia, post-menopausal and obesity.    No problem-specific Assessment & Plan notes found for this encounter.   Past Medical History:  Diagnosis Date  . Anemia   . Depression   . Eczema   . History of traumatic brain injury 1997  . Seizures ([place])    hx- after TBI, last sz 2001   . Uterine fibroid    history    Past Surgical History:  Procedure Laterality Date  . DILATION AND CURETTAGE OF UTERUS  1988  . UTERINE ARTERY EMBOLIZATION  2009    Family History  Problem Relation Age of Onset  . Adopted: Yes  . Stroke Mother   . Hypertension Father   . Diabetes Father   . Cancer Maternal Grandmother     Social History   Social History  . Marital status: Married    Spouse name: N/A  . Number of children: N/A  . Years of education: N/A   Occupational History  . Not on file.   Social History Main Topics  . Smoking status: Never Smoker  . Smokeless tobacco: Never Used  . Alcohol use 2.4 oz/week    4 Glasses of wine per week  . Drug use: No  . Sexual activity: Yes    Birth control/ protection: None   Other Topics Concern  . Not on file   Social History Narrative  . No narrative on file    Allergies  Allergen Reactions  . Prednisone Other (See Comments)    "crazy"  . Statins Other (See Comments)    Body aches  . Benadryl [Diphenhydramine] Palpitations    IV only-can take oral    Outpatient Medications Prior to Visit  Medication Sig Dispense Refill  . clobetasol cream (TEMOVATE) 0.07 % Apply 1 application topically 2 (two) times daily. 30 g 2  . Multiple Vitamin (MULTIVITAMIN) tablet Take 1 tablet by mouth daily.      Marland Kitchen  sertraline (ZOLOFT) 100 MG tablet Take 1 tablet (100 mg total) by mouth daily. PRN for menstrual cycle 90 tablet 1  . albuterol (PROVENTIL HFA;VENTOLIN HFA) 108 (90 Base) MCG/ACT inhaler Inhale into the lungs every 6 (six) hours as needed for wheezing or shortness of breath.    Marland Kitchen azithromycin (ZITHROMAX) 250 MG tablet Take by mouth daily.    . chlorpheniramine-HYDROcodone (TUSSIONEX PENNKINETIC ER) 10-8 MG/5ML SUER Take 5 mLs by mouth.    . dextromethorphan-guaiFENesin (MUCINEX DM) 30-600 MG 12hr tablet Take 1 tablet by mouth 2 (two) times daily.    Marland Kitchen levofloxacin (LEVAQUIN) 750 MG tablet Take 1 tablet (750 mg total) by  mouth daily. 7 tablet 0  . meloxicam (MOBIC) 7.5 MG tablet Take 1 tablet (7.5 mg total) by mouth 2 (two) times daily. 60 tablet 1   No facility-administered medications prior to visit.     Review of Systems  Constitutional: Negative for appetite change, chills, fatigue, fever, malaise/fatigue and weight loss.  HENT: Negative for ear discharge, ear pain and sore throat.   Eyes: Negative for blurred vision.  Respiratory: Negative for cough, sputum production, shortness of breath and wheezing.   Cardiovascular: Negative for chest pain, palpitations and leg swelling.  Gastrointestinal: Negative for abdominal pain, blood in stool, constipation, diarrhea, heartburn, melena and nausea.  Genitourinary: Negative for dysuria, frequency, hematuria and urgency.  Musculoskeletal: Positive for back pain. Negative for joint pain, myalgias and neck pain.  Skin: Negative for rash.  Neurological: Negative for dizziness, tingling, sensory change, focal weakness and headaches.  Endo/Heme/Allergies: Negative for environmental allergies and polydipsia. Does not bruise/bleed easily.  Psychiatric/Behavioral: Negative for decreased concentration, depression and suicidal ideas. The patient is not nervous/anxious and does not have insomnia.        PMS     Objective  Vitals:   05/17/16 0832  BP: 120/88  Pulse: 84  Weight: (!) 312 lb (141.5 kg)  Height: 5\' 8"  (1.727 m)    Physical Exam  Constitutional: She is well-developed, well-nourished, and in no distress. She is irritable. No distress.  HENT:  Head: Normocephalic and atraumatic.  Right Ear: External ear normal.  Left Ear: External ear normal.  Nose: Nose normal.  Mouth/Throat: Oropharynx is clear and moist.  Eyes: Conjunctivae and EOM are normal. Pupils are equal, round, and reactive to light. Right eye exhibits no discharge. Left eye exhibits no discharge.  Neck: Normal range of motion. Neck supple. No JVD present. No thyromegaly present.   Cardiovascular: Normal rate, regular rhythm, normal heart sounds and intact distal pulses.  Exam reveals no gallop and no friction rub.   No murmur heard. Pulmonary/Chest: Effort normal and breath sounds normal. No respiratory distress. She has no wheezes. She has no rales. She exhibits no tenderness.  Abdominal: Soft. Bowel sounds are normal. She exhibits no mass. There is no tenderness. There is no guarding.  Musculoskeletal: Normal range of motion. She exhibits no edema.  Lymphadenopathy:    She has no cervical adenopathy.  Neurological: She is alert. She has normal reflexes.  Skin: Skin is warm and dry. She is not diaphoretic.  Psychiatric: Mood and affect normal.  Nursing note and vitals reviewed.     Assessment & Plan  Problem List Items Addressed This Visit      Other   Anemia - Primary   Relevant Orders   CBC w/Diff/Platelet   Hypertriglyceridemia   Relevant Orders   Lipid Profile   Family history of PMS (premenstrual syndrome)   BMI  40.0-44.9, adult [facility])   Relevant Orders   Lipid Profile   Renal Function Panel    Other Visit Diagnoses    Taking medication for chronic disease       NSAID   Relevant Orders   Renal Function Panel      No orders of the defined types were placed in this encounter.     Dr. [facility]  05/17/16

## 2016-05-18 LAB — RENAL FUNCTION PANEL
ALBUMIN: 4.1 g/dL (ref 3.5–5.5)
BUN/Creatinine Ratio: 30 — ABNORMAL HIGH (ref 9–23)
BUN: 18 mg/dL (ref 6–24)
CALCIUM: 9.4 mg/dL (ref 8.7–10.2)
CHLORIDE: 102 mmol/L (ref 96–106)
CO2: 22 mmol/L (ref 18–29)
Creatinine, Ser: 0.6 mg/dL (ref 0.57–1.00)
GFR calc Af Amer: 125 mL/min/{1.73_m2} (ref >59)
GFR, EST NON AFRICAN AMERICAN: 108 mL/min/{1.73_m2} (ref >59)
GLUCOSE: 80 mg/dL (ref 65–99)
POTASSIUM: 4.6 mmol/L (ref 3.5–5.2)
Phosphorus: 2.9 mg/dL (ref 2.5–4.5)
SODIUM: 141 mmol/L (ref 134–144)

## 2016-05-18 LAB — CBC WITH DIFFERENTIAL/PLATELET
BASOS ABS: 0 10*3/uL (ref 0.0–0.2)
Basos: 1 %
EOS (ABSOLUTE): 0.1 10*3/uL (ref 0.0–0.4)
Eos: 2 %
HEMOGLOBIN: 13.2 g/dL (ref 11.1–15.9)
Hematocrit: 41.3 % (ref 34.0–46.6)
IMMATURE GRANS (ABS): 0 10*3/uL (ref 0.0–0.1)
IMMATURE GRANULOCYTES: 0 %
Lymphocytes Absolute: 1.7 10*3/uL (ref 0.7–3.1)
Lymphs: 30 %
MCH: 28.4 pg (ref 26.6–33.0)
MCHC: 32 g/dL (ref 31.5–35.7)
MCV: 89 fL (ref 79–97)
MONOCYTES: 10 %
Monocytes Absolute: 0.6 10*3/uL (ref 0.1–0.9)
NEUTROS PCT: 57 %
Neutrophils Absolute: 3.2 10*3/uL (ref 1.4–7.0)
PLATELETS: 319 10*3/uL (ref 150–379)
RBC: 4.64 x10E6/uL (ref 3.77–5.28)
RDW: 14.4 % (ref 12.3–15.4)
WBC: 5.6 10*3/uL (ref 3.4–10.8)

## 2016-05-18 LAB — LIPID PANEL
CHOL/HDL RATIO: 5 ratio — AB (ref 0.0–4.4)
CHOLESTEROL TOTAL: 222 mg/dL — AB (ref 100–199)
HDL: 44 mg/dL (ref >39)
LDL Calculated: 131 mg/dL — ABNORMAL HIGH (ref 0–99)
Triglycerides: 235 mg/dL — ABNORMAL HIGH (ref 0–149)
VLDL Cholesterol Cal: 47 mg/dL — ABNORMAL HIGH (ref 5–40)

## 2016-05-30 DIAGNOSIS — M542 Cervicalgia: Secondary | ICD-10-CM | POA: Diagnosis not present

## 2016-05-30 DIAGNOSIS — M7542 Impingement syndrome of left shoulder: Secondary | ICD-10-CM | POA: Diagnosis not present

## 2016-05-30 DIAGNOSIS — M9907 Segmental and somatic dysfunction of upper extremity: Secondary | ICD-10-CM | POA: Diagnosis not present

## 2016-05-30 DIAGNOSIS — M9901 Segmental and somatic dysfunction of cervical region: Secondary | ICD-10-CM | POA: Diagnosis not present

## 2016-06-14 DIAGNOSIS — M542 Cervicalgia: Secondary | ICD-10-CM | POA: Diagnosis not present

## 2016-06-14 DIAGNOSIS — M9907 Segmental and somatic dysfunction of upper extremity: Secondary | ICD-10-CM | POA: Diagnosis not present

## 2016-06-14 DIAGNOSIS — M9901 Segmental and somatic dysfunction of cervical region: Secondary | ICD-10-CM | POA: Diagnosis not present

## 2016-07-11 ENCOUNTER — Ambulatory Visit: Payer: Self-pay | Admitting: Advanced Practice Midwife

## 2016-07-12 MED FILL — CLOBETASOL 0.05% CREAM: 0.05 | 15 days supply | Qty: 30 | Fill #1

## 2016-07-12 MED FILL — SERTRALINE HCL 100 MG TAB: 100 | 30 days supply | Qty: 30 | Fill #1

## 2016-07-19 DIAGNOSIS — M9905 Segmental and somatic dysfunction of pelvic region: Secondary | ICD-10-CM | POA: Diagnosis not present

## 2016-07-19 DIAGNOSIS — M9903 Segmental and somatic dysfunction of lumbar region: Secondary | ICD-10-CM | POA: Diagnosis not present

## 2016-07-19 DIAGNOSIS — M4607 Spinal enthesopathy, lumbosacral region: Secondary | ICD-10-CM | POA: Diagnosis not present

## 2016-07-19 DIAGNOSIS — M4608 Spinal enthesopathy, sacral and sacrococcygeal region: Secondary | ICD-10-CM | POA: Diagnosis not present

## 2016-08-09 DIAGNOSIS — M4608 Spinal enthesopathy, sacral and sacrococcygeal region: Secondary | ICD-10-CM | POA: Diagnosis not present

## 2016-08-09 DIAGNOSIS — M9905 Segmental and somatic dysfunction of pelvic region: Secondary | ICD-10-CM | POA: Diagnosis not present

## 2016-08-09 DIAGNOSIS — M9903 Segmental and somatic dysfunction of lumbar region: Secondary | ICD-10-CM | POA: Diagnosis not present

## 2016-08-09 DIAGNOSIS — M4607 Spinal enthesopathy, lumbosacral region: Secondary | ICD-10-CM | POA: Diagnosis not present

## 2016-08-14 MED FILL — CLOBETASOL 0.05% CREAM: 0.05 | 15 days supply | Qty: 30 | Fill #2

## 2016-08-14 MED FILL — SERTRALINE HCL 100 MG TAB: 100 | 30 days supply | Qty: 30 | Fill #2

## 2016-09-13 DIAGNOSIS — M9903 Segmental and somatic dysfunction of lumbar region: Secondary | ICD-10-CM | POA: Diagnosis not present

## 2016-09-13 DIAGNOSIS — M9905 Segmental and somatic dysfunction of pelvic region: Secondary | ICD-10-CM | POA: Diagnosis not present

## 2016-09-13 DIAGNOSIS — M4607 Spinal enthesopathy, lumbosacral region: Secondary | ICD-10-CM | POA: Diagnosis not present

## 2016-09-13 DIAGNOSIS — M4608 Spinal enthesopathy, sacral and sacrococcygeal region: Secondary | ICD-10-CM | POA: Diagnosis not present

## 2016-09-26 ENCOUNTER — Ambulatory Visit
Admission: RE | Admit: 2016-09-26 | Discharge: 2016-09-26 | Disposition: A | Discharge status: Home / Self Care | Payer: BLUE CROSS/BLUE SHIELD | Source: Ambulatory Visit | Attending: Family Medicine | Admitting: Family Medicine

## 2016-09-26 ENCOUNTER — Ambulatory Visit (INDEPENDENT_AMBULATORY_CARE_PROVIDER_SITE_OTHER): Payer: BLUE CROSS/BLUE SHIELD | Admitting: Family Medicine

## 2016-09-26 ENCOUNTER — Encounter: Payer: Self-pay | Admitting: Family Medicine

## 2016-09-26 VITALS — BP 130/80 | HR 86 | Ht 68.0 in | Wt 319.0 lb

## 2016-09-26 DIAGNOSIS — R05 Cough: Secondary | ICD-10-CM | POA: Insufficient documentation

## 2016-09-26 DIAGNOSIS — Z8701 Personal history of pneumonia (recurrent): Secondary | ICD-10-CM | POA: Insufficient documentation

## 2016-09-26 DIAGNOSIS — R053 Chronic cough: Secondary | ICD-10-CM

## 2016-09-26 DIAGNOSIS — F4323 Adjustment disorder with mixed anxiety and depressed mood: Secondary | ICD-10-CM | POA: Diagnosis not present

## 2016-09-26 MED ORDER — MONTELUKAST SODIUM 10 MG PO TABS: 10.0000 mg | ORAL_TABLET | Freq: Every day | ORAL | 3 refills | Status: DC

## 2016-09-26 NOTE — Progress Notes (Signed)
Name: Valerie Underwood   MRN: [id number]    DOB: [birth year]   Date:09/26/2016       Progress Note  Subjective  Chief Complaint  Chief Complaint  Patient presents with  . Follow-up    pneumonia- had chest xray in Feb- showed a questionable pneumonia- was supposed to repeat in June and didn't. Has been having a cough and thick sputum    Cough  This is a recurrent problem. The current episode started more than 1 month ago. The problem has been waxing and waning. The cough is productive of sputum (occ yellow). Associated symptoms include nasal congestion. Pertinent negatives include no chest pain, chills, ear congestion, ear pain, fever, headaches, heartburn, hemoptysis, myalgias, postnasal drip, rash, rhinorrhea, sore throat, shortness of breath, sweats, weight loss or wheezing. The symptoms are aggravated by pollens. Risk factors for lung disease include occupational exposure. She has tried a beta-agonist inhaler for the symptoms. The treatment provided moderate relief. There is no history of environmental allergies.    No problem-specific Assessment & Plan notes found for this encounter.   Past Medical History:  Diagnosis Date  . Anemia   . Depression   . Eczema   . History of traumatic brain injury 1997  . Seizures ([place])    hx- after TBI, last sz 2001  . Uterine fibroid    history    Past Surgical History:  Procedure Laterality Date  . DILATION AND CURETTAGE OF UTERUS  1988  . UTERINE ARTERY EMBOLIZATION  2009    Family History  Problem Relation Age of Onset  . Adopted: Yes  . Stroke Mother   . Hypertension Father   . Diabetes Father   . Cancer Maternal Grandmother     Social History   Social History  . Marital status: Married    Spouse name: N/A  . Number of children: N/A  . Years of education: N/A   Occupational History  . Not on file.   Social History Main Topics  . Smoking status: Never Smoker  . Smokeless tobacco: Never Used  . Alcohol use 2.4 oz/week     4 Glasses of wine per week  . Drug use: No  . Sexual activity: Yes    Birth control/ protection: None   Other Topics Concern  . Not on file   Social History Narrative  . No narrative on file    Allergies  Allergen Reactions  . Prednisone Other (See Comments)    "crazy"  . Statins Other (See Comments)    Body aches  . Benadryl [Diphenhydramine] Palpitations    IV only-can take oral    Outpatient Medications Prior to Visit  Medication Sig Dispense Refill  . clobetasol cream (TEMOVATE) 8.41 % Apply 1 application topically 2 (two) times daily. 30 g 2  . Multiple Vitamin (MULTIVITAMIN) tablet Take 1 tablet by mouth daily.      . sertraline (ZOLOFT) 100 MG tablet Take 1 tablet (100 mg total) by mouth daily. PRN for menstrual cycle 90 tablet 1   No facility-administered medications prior to visit.     Review of Systems  Constitutional: Negative for chills, fever, malaise/fatigue and weight loss.  HENT: Negative for ear discharge, ear pain, postnasal drip, rhinorrhea and sore throat.   Eyes: Negative for blurred vision.  Respiratory: Positive for cough. Negative for hemoptysis, sputum production, shortness of breath and wheezing.   Cardiovascular: Negative for chest pain, palpitations and leg swelling.  Gastrointestinal: Negative for abdominal pain, blood in stool,  constipation, diarrhea, heartburn, melena and nausea.  Genitourinary: Negative for dysuria, frequency, hematuria and urgency.  Musculoskeletal: Negative for back pain, joint pain, myalgias and neck pain.  Skin: Negative for rash.  Neurological: Negative for dizziness, tingling, sensory change, focal weakness and headaches.  Endo/Heme/Allergies: Negative for environmental allergies and polydipsia. Does not bruise/bleed easily.  Psychiatric/Behavioral: Negative for depression and suicidal ideas. The patient is not nervous/anxious and does not have insomnia.      Objective  Vitals:   09/26/16 1350  BP: 130/80   Pulse: 86  SpO2: 98%  Weight: (!) 319 lb (144.7 kg)  Height: 5\' 8"  (1.727 m)    Physical Exam  Constitutional: She is well-developed, well-nourished, and in no distress. No distress.  HENT:  Head: Normocephalic and atraumatic.  Right Ear: External ear normal.  Left Ear: External ear normal.  Nose: Nose normal.  Mouth/Throat: Oropharynx is clear and moist.  Eyes: Pupils are equal, round, and reactive to light. Conjunctivae and EOM are normal. Right eye exhibits no discharge. Left eye exhibits no discharge.  Neck: Normal range of motion. Neck supple. No JVD present. No thyromegaly present.  Cardiovascular: Normal rate, regular rhythm, normal heart sounds and intact distal pulses.  Exam reveals no gallop and no friction rub.   No murmur heard. Pulmonary/Chest: Effort normal and breath sounds normal. She has no wheezes. She has no rales.  Abdominal: Soft. Bowel sounds are normal. She exhibits no mass. There is no tenderness. There is no guarding.  Musculoskeletal: Normal range of motion. She exhibits no edema.  Lymphadenopathy:    She has no cervical adenopathy.  Neurological: She is alert.  Skin: Skin is warm and dry. She is not diaphoretic.  Psychiatric: Mood and affect normal.  Nursing note and vitals reviewed.     Assessment & Plan  Problem List Items Addressed This Visit    None    Visit Diagnoses    Chronic cough    -  Primary   Relevant Medications   montelukast (SINGULAIR) 10 MG tablet   Other Relevant Orders   DG Chest 2 View   History of pneumonia       Relevant Orders   DG Chest 2 View      Meds ordered this encounter  Medications  . montelukast (SINGULAIR) 10 MG tablet    Sig: Take 1 tablet (10 mg total) by mouth at bedtime.    Dispense:  30 tablet    Refill:  3      Dr. [facility]  09/26/16

## 2016-10-18 DIAGNOSIS — M9903 Segmental and somatic dysfunction of lumbar region: Secondary | ICD-10-CM | POA: Diagnosis not present

## 2016-10-18 DIAGNOSIS — M5442 Lumbago with sciatica, left side: Secondary | ICD-10-CM | POA: Diagnosis not present

## 2016-10-18 DIAGNOSIS — M5441 Lumbago with sciatica, right side: Secondary | ICD-10-CM | POA: Diagnosis not present

## 2016-10-18 DIAGNOSIS — M9904 Segmental and somatic dysfunction of sacral region: Secondary | ICD-10-CM | POA: Diagnosis not present

## 2016-10-25 DIAGNOSIS — F4323 Adjustment disorder with mixed anxiety and depressed mood: Secondary | ICD-10-CM | POA: Diagnosis not present

## 2016-11-13 DIAGNOSIS — M5441 Lumbago with sciatica, right side: Secondary | ICD-10-CM | POA: Diagnosis not present

## 2016-11-13 DIAGNOSIS — M9904 Segmental and somatic dysfunction of sacral region: Secondary | ICD-10-CM | POA: Diagnosis not present

## 2016-11-13 DIAGNOSIS — M5442 Lumbago with sciatica, left side: Secondary | ICD-10-CM | POA: Diagnosis not present

## 2016-11-13 DIAGNOSIS — M9903 Segmental and somatic dysfunction of lumbar region: Secondary | ICD-10-CM | POA: Diagnosis not present

## 2016-11-21 DIAGNOSIS — F4323 Adjustment disorder with mixed anxiety and depressed mood: Secondary | ICD-10-CM | POA: Diagnosis not present

## 2016-12-03 ENCOUNTER — Ambulatory Visit
Admission: EM | Admit: 2016-12-03 | Discharge: 2016-12-03 | Disposition: A | Discharge status: Home / Self Care | Payer: BLUE CROSS/BLUE SHIELD | Attending: Family Medicine | Admitting: Family Medicine

## 2016-12-03 DIAGNOSIS — R05 Cough: Secondary | ICD-10-CM

## 2016-12-03 DIAGNOSIS — R059 Cough, unspecified: Secondary | ICD-10-CM

## 2016-12-03 MED ORDER — HYDROCOD POLST-CPM POLST ER 10-8 MG/5ML PO SUER: 5.0000 mL | Freq: Two times a day (BID) | ORAL | 0 refills | Status: DC | PRN

## 2016-12-03 NOTE — Discharge Instructions (Signed)
Cough medication as prescribed. ° °Take care ° °Dr. Wyett Narine  °

## 2016-12-03 NOTE — ED Triage Notes (Signed)
Pt with one week of non-productive cough. "I need cough medication" because I can't sleep. Denies pain or fever.

## 2016-12-03 NOTE — ED Provider Notes (Signed)
[facility]    CSN: [id number] Arrival date & time: 12/03/16  0805  History   Chief Complaint Chief Complaint  Patient presents with  . Cough   HPI  46 year old female history of chronic cough presents with cough.  Patient reports a recent exacerbation of cough over the past week. She states she thinks she got this from work. Cough is dry. Severe. Worse at night. No associated fevers or chills. No shortness of breath. No known exacerbating or relieving factors. No other associated symptoms. No other complaints at this time.  Past Medical History:  Diagnosis Date  . Anemia   . Depression   . Eczema   . History of traumatic brain injury 1997  . Seizures ([place])    hx- after TBI, last sz 2001  . Uterine fibroid    history   Patient Active Problem List   Diagnosis Date Noted  . Family history of PMS (premenstrual syndrome) 05/17/2016  . BMI 40.0-44.9, adult ([place]) 05/17/2016  . Metatarsalgia 05/24/2011  . Gait abnormality 05/24/2011  . Hypertriglyceridemia 01/18/2011  . Morbid obesity ([place]) 01/17/2011  . Depression 01/17/2011  . Anemia   . Uterine fibroid   . History of traumatic brain injury   . Seizures ([place])   . Left foot pain 11/11/2010   Past Surgical History:  Procedure Laterality Date  . DILATION AND CURETTAGE OF UTERUS  1988  . UTERINE ARTERY EMBOLIZATION  2009    OB History    Gravida Para Term Preterm AB Living   1       1     SAB TAB Ectopic Multiple Live Births   1              Home Medications    Prior to Admission medications   Medication Sig Start Date End Date Taking? Authorizing Provider  chlorpheniramine-HYDROcodone (TUSSIONEX PENNKINETIC ER) 10-8 MG/5ML SUER Take 5 mLs by mouth every 12 (twelve) hours as needed for cough. 12/03/16   Coral Spikes, DO  clobetasol cream (TEMOVATE) 1.32 % Apply 1 application topically 2 (two) times daily. 04/17/16   Juline Patch, MD  montelukast (SINGULAIR) 10 MG tablet Take 1 tablet (10 mg total)  by mouth at bedtime. 09/26/16   Juline Patch, MD  Multiple Vitamin (MULTIVITAMIN) tablet Take 1 tablet by mouth daily.      [provider]  sertraline (ZOLOFT) 100 MG tablet Take 1 tablet (100 mg total) by mouth daily. PRN for menstrual cycle 04/17/16   Juline Patch, MD   Family History Family History  Problem Relation Age of Onset  . Adopted: Yes  . Stroke Mother   . Hypertension Father   . Diabetes Father   . Cancer Maternal Grandmother     Social History Social History  Substance Use Topics  . Smoking status: Never Smoker  . Smokeless tobacco: Never Used  . Alcohol use 2.4 oz/week    4 Glasses of wine per week   Allergies   Prednisone; Statins; and Benadryl [diphenhydramine]   Review of Systems Review of Systems  Constitutional: Negative for fever.  Respiratory: Positive for cough and wheezing. Negative for shortness of breath.    Physical Exam Triage Vital Signs ED Triage Vitals [12/03/16 0814]  Enc Vitals Group     BP      Pulse      Resp      Temp      Temp src      SpO2  Weight      Height      Head Circumference      Peak Flow      Pain Score 0     Pain Loc      Pain Edu?      Excl. in [place]?    Updated Vital Signs BP (!) 157/79 (BP Location: Left Arm)   Pulse 72   Temp 97.7 F (36.5 C) (Oral)   Resp 18   Ht 5\' 8"  (1.727 m)   Wt (!) 316 lb (143.3 kg)   LMP 09/02/2016   SpO2 99%   BMI 48.05 kg/m   Physical Exam  Constitutional: She is oriented to person, place, and time. She appears well-developed. No distress.  HENT:  Head: Normocephalic and atraumatic.  Mouth/Throat: Oropharynx is clear and moist.  Normal TM's bilaterally.  Cardiovascular: Normal rate and regular rhythm.   No murmur heard. Pulmonary/Chest: Effort normal and breath sounds normal. No respiratory distress. She has no wheezes. She has no rales.  Neurological: She is alert and oriented to person, place, and time.  Skin: Skin is warm. No rash noted.    Psychiatric: She has a normal mood and affect.  Vitals reviewed.   UC Treatments / Results  Labs (all labs ordered are listed, but only abnormal results are displayed) Labs Reviewed - No data to display  EKG  EKG Interpretation None      Radiology No results found.  Procedures Procedures (including critical care time)  Medications Ordered in UC Medications - No data to display   Initial Impression / Assessment and Plan / UC Course  I have reviewed the triage vital signs and the nursing notes.  Pertinent labs & imaging results that were available during my care of the patient were reviewed by me and considered in my medical decision making (see chart for details).    46 year old female presents with cough. Likely the beginnings of bronchitis. She cannot tolerate prednisone. Treating with Tussionex. Supportive care.  Final Clinical Impressions(s) / UC Diagnoses   Final diagnoses:  Cough   New Prescriptions Discharge Medication List as of 12/03/2016  8:30 AM    START taking these medications   Details  chlorpheniramine-HYDROcodone (TUSSIONEX PENNKINETIC ER) 10-8 MG/5ML SUER Take 5 mLs by mouth every 12 (twelve) hours as needed for cough., Starting Sun 12/03/2016, Print       Controlled Substance Prescriptions [place] Controlled Substance Registry consulted? No   Coral Spikes, [place] 12/03/16 [phone]

## 2017-01-02 DIAGNOSIS — F4323 Adjustment disorder with mixed anxiety and depressed mood: Secondary | ICD-10-CM | POA: Diagnosis not present

## 2017-01-23 DIAGNOSIS — F4323 Adjustment disorder with mixed anxiety and depressed mood: Secondary | ICD-10-CM | POA: Diagnosis not present

## 2017-02-09 ENCOUNTER — Other Ambulatory Visit: Payer: Self-pay | Admitting: Family Medicine

## 2017-02-09 DIAGNOSIS — Z1239 Encounter for other screening for malignant neoplasm of breast: Secondary | ICD-10-CM

## 2017-02-12 ENCOUNTER — Ambulatory Visit (HOSPITAL_BASED_OUTPATIENT_CLINIC_OR_DEPARTMENT_OTHER)
Admission: RE | Admit: 2017-02-12 | Discharge: 2017-02-12 | Disposition: A | Discharge status: Home / Self Care | Payer: BLUE CROSS/BLUE SHIELD | Source: Ambulatory Visit | Attending: Family Medicine | Admitting: Family Medicine

## 2017-02-12 DIAGNOSIS — Z1231 Encounter for screening mammogram for malignant neoplasm of breast: Secondary | ICD-10-CM | POA: Insufficient documentation

## 2017-02-12 DIAGNOSIS — Z1239 Encounter for other screening for malignant neoplasm of breast: Secondary | ICD-10-CM

## 2017-02-20 MED FILL — SERTRALINE HCL 100 MG TAB: 100 | 30 days supply | Qty: 30 | Fill #3

## 2017-02-21 DIAGNOSIS — G5601 Carpal tunnel syndrome, right upper limb: Secondary | ICD-10-CM | POA: Diagnosis not present

## 2017-02-21 DIAGNOSIS — M531 Cervicobrachial syndrome: Secondary | ICD-10-CM | POA: Diagnosis not present

## 2017-02-21 DIAGNOSIS — M9901 Segmental and somatic dysfunction of cervical region: Secondary | ICD-10-CM | POA: Diagnosis not present

## 2017-02-27 DIAGNOSIS — F4323 Adjustment disorder with mixed anxiety and depressed mood: Secondary | ICD-10-CM | POA: Diagnosis not present

## 2017-03-07 DIAGNOSIS — M9901 Segmental and somatic dysfunction of cervical region: Secondary | ICD-10-CM | POA: Diagnosis not present

## 2017-03-07 DIAGNOSIS — G5601 Carpal tunnel syndrome, right upper limb: Secondary | ICD-10-CM | POA: Diagnosis not present

## 2017-03-07 DIAGNOSIS — M531 Cervicobrachial syndrome: Secondary | ICD-10-CM | POA: Diagnosis not present

## 2017-03-13 DIAGNOSIS — F4323 Adjustment disorder with mixed anxiety and depressed mood: Secondary | ICD-10-CM | POA: Diagnosis not present

## 2017-03-16 ENCOUNTER — Encounter: Payer: BLUE CROSS/BLUE SHIELD | Admitting: Family Medicine

## 2017-03-21 DIAGNOSIS — M531 Cervicobrachial syndrome: Secondary | ICD-10-CM | POA: Diagnosis not present

## 2017-03-21 DIAGNOSIS — G5601 Carpal tunnel syndrome, right upper limb: Secondary | ICD-10-CM | POA: Diagnosis not present

## 2017-03-21 DIAGNOSIS — M9901 Segmental and somatic dysfunction of cervical region: Secondary | ICD-10-CM | POA: Diagnosis not present

## 2017-03-27 DIAGNOSIS — M9901 Segmental and somatic dysfunction of cervical region: Secondary | ICD-10-CM | POA: Diagnosis not present

## 2017-03-27 DIAGNOSIS — M531 Cervicobrachial syndrome: Secondary | ICD-10-CM | POA: Diagnosis not present

## 2017-03-27 DIAGNOSIS — G5601 Carpal tunnel syndrome, right upper limb: Secondary | ICD-10-CM | POA: Diagnosis not present

## 2017-04-10 ENCOUNTER — Encounter: Payer: BLUE CROSS/BLUE SHIELD | Admitting: Family Medicine

## 2017-04-17 MED FILL — SERTRALINE HCL 100 MG TAB: 100 | 30 days supply | Qty: 30 | Fill #4

## 2017-04-18 DIAGNOSIS — M722 Plantar fascial fibromatosis: Secondary | ICD-10-CM | POA: Diagnosis not present

## 2017-04-18 DIAGNOSIS — M4607 Spinal enthesopathy, lumbosacral region: Secondary | ICD-10-CM | POA: Diagnosis not present

## 2017-04-18 DIAGNOSIS — M7918 Myalgia, other site: Secondary | ICD-10-CM | POA: Diagnosis not present

## 2017-04-18 DIAGNOSIS — M9903 Segmental and somatic dysfunction of lumbar region: Secondary | ICD-10-CM | POA: Diagnosis not present

## 2017-04-18 DIAGNOSIS — Z01419 Encounter for gynecological examination (general) (routine) without abnormal findings: Secondary | ICD-10-CM | POA: Diagnosis not present

## 2017-04-18 DIAGNOSIS — M9906 Segmental and somatic dysfunction of lower extremity: Secondary | ICD-10-CM | POA: Diagnosis not present

## 2017-04-24 DIAGNOSIS — F4323 Adjustment disorder with mixed anxiety and depressed mood: Secondary | ICD-10-CM | POA: Diagnosis not present

## 2017-05-02 ENCOUNTER — Encounter: Payer: Self-pay | Admitting: Family Medicine

## 2017-05-02 ENCOUNTER — Ambulatory Visit: Payer: BLUE CROSS/BLUE SHIELD | Admitting: Family Medicine

## 2017-05-02 VITALS — BP 130/86 | HR 80 | Ht 68.0 in | Wt 321.0 lb

## 2017-05-02 DIAGNOSIS — Z6841 Body Mass Index (BMI) 40.0 and over, adult: Secondary | ICD-10-CM | POA: Diagnosis not present

## 2017-05-02 DIAGNOSIS — F339 Major depressive disorder, recurrent, unspecified: Secondary | ICD-10-CM | POA: Diagnosis not present

## 2017-05-02 DIAGNOSIS — D485 Neoplasm of uncertain behavior of skin: Secondary | ICD-10-CM | POA: Diagnosis not present

## 2017-05-02 DIAGNOSIS — E781 Pure hyperglyceridemia: Secondary | ICD-10-CM | POA: Diagnosis not present

## 2017-05-02 DIAGNOSIS — D500 Iron deficiency anemia secondary to blood loss (chronic): Secondary | ICD-10-CM | POA: Diagnosis not present

## 2017-05-02 DIAGNOSIS — L309 Dermatitis, unspecified: Secondary | ICD-10-CM

## 2017-05-02 DIAGNOSIS — D225 Melanocytic nevi of trunk: Secondary | ICD-10-CM | POA: Diagnosis not present

## 2017-05-02 MED ORDER — SERTRALINE HCL 100 MG PO TABS: 100.0000 mg | ORAL_TABLET | Freq: Every day | ORAL | 1 refills | Status: AC

## 2017-05-02 MED ORDER — CLOBETASOL PROPIONATE 0.05 % EX CREA: 1.0000 "application " | TOPICAL_CREAM | Freq: Two times a day (BID) | CUTANEOUS | 11 refills | Status: AC

## 2017-05-02 NOTE — Progress Notes (Signed)
Name: Valerie Underwood   MRN: [id number]    DOB: [birth year]   Date:05/02/2017       Progress Note  Subjective  Chief Complaint  Chief Complaint  Patient presents with  . Depression  . Eczema  . labs needed    renal, lipid    Depression         The patient presents with no depression.  This is a chronic problem.  The current episode started more than 1 year ago.   The onset quality is gradual.   The problem occurs every several days.  The problem has been gradually improving since onset.  Associated symptoms include no decreased concentration, no fatigue, no helplessness, no hopelessness, does not have insomnia, not irritable, no restlessness, no decreased interest, no appetite change, no body aches, no myalgias, no headaches, no indigestion, not sad and no suicidal ideas.     The symptoms are aggravated by nothing.  Past treatments include SSRIs - Selective serotonin reuptake inhibitors.  Compliance with treatment is good.  Previous treatment provided moderate relief.   Pertinent negatives include no chronic fatigue syndrome, no chronic pain, no fibromyalgia, no hypothyroidism, no thyroid problem, no chronic illness, no recent illness, no life-threatening condition, no physical disability, no terminal illness, no recent psychiatric admission, no Alzheimer's disease, no brain trauma, no dementia, no anxiety, no bipolar disorder, no eating disorder, no depression, no mental health disorder, no obsessive-compulsive disorder, no post-traumatic stress disorder, no schizophrenia, no suicide attempts and no head trauma. Rash  The current episode started more than 1 year ago. The problem has been waxing and waning since onset. The affected locations include the left hand and right hand. The rash is characterized by redness. She was exposed to nothing. Pertinent negatives include no anorexia, cough, diarrhea, fatigue, fever, joint pain, shortness of breath or sore throat. Past treatments include antihistamine.  The treatment provided moderate relief.    No problem-specific Assessment & Plan notes found for this encounter.   Past Medical History:  Diagnosis Date  . Anemia   . Depression   . Eczema   . History of traumatic brain injury 1997  . Seizures ([place])    hx- after TBI, last sz 2001  . Uterine fibroid    history    Past Surgical History:  Procedure Laterality Date  . DILATION AND CURETTAGE OF UTERUS  1988  . UTERINE ARTERY EMBOLIZATION  2009    Family History  Adopted: Yes  Problem Relation Age of Onset  . Stroke Mother   . Hypertension Father   . Diabetes Father   . Cancer Maternal Grandmother     Social History   Socioeconomic History  . Marital status: Married    Spouse name: Not on file  . Number of children: Not on file  . Years of education: Not on file  . Highest education level: Not on file  Social Needs  . Financial resource strain: Not on file  . Food insecurity - worry: Not on file  . Food insecurity - inability: Not on file  . Transportation needs - medical: Not on file  . Transportation needs - non-medical: Not on file  Occupational History  . Not on file  Tobacco Use  . Smoking status: Never Smoker  . Smokeless tobacco: Never Used  Substance and Sexual Activity  . Alcohol use: Yes    Alcohol/week: 2.4 oz    Types: 4 Glasses of wine per week  . Drug use: No  . Sexual  activity: Yes    Birth control/protection: None  Other Topics Concern  . Not on file  Social History Narrative  . Not on file    Allergies  Allergen Reactions  . Prednisone Other (See Comments)    "crazy"  . Statins Other (See Comments)    Body aches  . Benadryl [Diphenhydramine] Palpitations    IV only-can take oral    Outpatient Medications Prior to Visit  Medication Sig Dispense Refill  . Multiple Vitamin (MULTIVITAMIN) tablet Take 1 tablet by mouth daily.      . clobetasol cream (TEMOVATE) 4.25 % Apply 1 application topically 2 (two) times daily. 30 g 2  .  sertraline (ZOLOFT) 100 MG tablet Take 1 tablet (100 mg total) by mouth daily. PRN for menstrual cycle 90 tablet 1  . chlorpheniramine-HYDROcodone (TUSSIONEX PENNKINETIC ER) 10-8 MG/5ML SUER Take 5 mLs by mouth every 12 (twelve) hours as needed for cough. 115 mL 0  . montelukast (SINGULAIR) 10 MG tablet Take 1 tablet (10 mg total) by mouth at bedtime. 30 tablet 3   No facility-administered medications prior to visit.     Review of Systems  Constitutional: Negative for appetite change, chills, fatigue, fever, malaise/fatigue and weight loss.  HENT: Negative for ear discharge, ear pain and sore throat.   Eyes: Negative for blurred vision.  Respiratory: Negative for cough, sputum production, shortness of breath and wheezing.   Cardiovascular: Negative for chest pain, palpitations and leg swelling.  Gastrointestinal: Negative for abdominal pain, anorexia, blood in stool, constipation, diarrhea, heartburn, melena and nausea.  Genitourinary: Negative for dysuria, frequency, hematuria and urgency.  Musculoskeletal: Negative for back pain, joint pain, myalgias and neck pain.  Skin: Positive for rash.  Neurological: Negative for dizziness, tingling, sensory change, focal weakness and headaches.  Endo/Heme/Allergies: Negative for environmental allergies and polydipsia. Does not bruise/bleed easily.  Psychiatric/Behavioral: Positive for depression. Negative for decreased concentration and suicidal ideas. The patient is not nervous/anxious and does not have insomnia.      Objective  Vitals:   05/02/17 0919  BP: 130/86  Pulse: 80  Weight: (!) 321 lb (145.6 kg)  Height: 5\' 8"  (1.727 m)    Physical Exam  Constitutional: She is well-developed, well-nourished, and in no distress. She is not irritable. No distress.  HENT:  Head: Normocephalic and atraumatic.  Right Ear: External ear normal.  Left Ear: External ear normal.  Nose: Nose normal.  Mouth/Throat: Oropharynx is clear and moist.  Eyes:  Conjunctivae and EOM are normal. Pupils are equal, round, and reactive to light. Right eye exhibits no discharge. Left eye exhibits no discharge.  Neck: Normal range of motion. Neck supple. No JVD present. No thyromegaly present.  Cardiovascular: Normal rate, regular rhythm, normal heart sounds and intact distal pulses. Exam reveals no gallop and no friction rub.  No murmur heard. Pulmonary/Chest: Effort normal. She has no wheezes. She has no rales.  Abdominal: Soft. Bowel sounds are normal. She exhibits no mass. There is no tenderness. There is no guarding.  Musculoskeletal: Normal range of motion. She exhibits no edema.  Lymphadenopathy:    She has no cervical adenopathy.  Neurological: She is alert.  Skin: Skin is warm and dry. She is not diaphoretic.  Psychiatric: Mood and affect normal.  Nursing note and vitals reviewed.     Assessment & Plan  Problem List Items Addressed This Visit      Other   Anemia   Hypertriglyceridemia   Relevant Orders   Lipid panel  BMI 40.0-44.9, adult [facility])   Relevant Orders   Renal Function Panel   Lipid panel   Depression, recurrent ([place]) - Primary   Relevant Medications   sertraline (ZOLOFT) 100 MG tablet    Other Visit Diagnoses    Eczema, unspecified type       Chronic eczema of hand       partial resolution with temavate   Relevant Medications   clobetasol cream (TEMOVATE) 0.05 %      Meds ordered this encounter  Medications  . sertraline (ZOLOFT) 100 MG tablet    Sig: Take 1 tablet (100 mg total) by mouth daily. PRN for menstrual cycle    Dispense:  90 tablet    Refill:  1  . clobetasol cream (TEMOVATE) 0.05 %    Sig: Apply 1 application topically 2 (two) times daily.    Dispense:  30 g    Refill:  11  The 10-year ASCVD risk score Mikey Bussing DC Jr., et al., 2013) is: 1.8%   Values used to calculate the score:     Age: 34 years     Sex: Female     Is Non-Hispanic African American: No     Diabetic: No     Tobacco smoker: No      Systolic Blood Pressure: 161 mmHg     Is BP treated: No     HDL Cholesterol: 44 mg/dL     Total Cholesterol: 222 mg/dL    Dr. [facility]  05/02/17

## 2017-05-07 ENCOUNTER — Other Ambulatory Visit: Payer: Self-pay

## 2017-05-09 DIAGNOSIS — M4607 Spinal enthesopathy, lumbosacral region: Secondary | ICD-10-CM | POA: Diagnosis not present

## 2017-05-09 DIAGNOSIS — E781 Pure hyperglyceridemia: Secondary | ICD-10-CM | POA: Diagnosis not present

## 2017-05-09 DIAGNOSIS — M722 Plantar fascial fibromatosis: Secondary | ICD-10-CM | POA: Diagnosis not present

## 2017-05-09 DIAGNOSIS — Z6841 Body Mass Index (BMI) 40.0 and over, adult: Secondary | ICD-10-CM | POA: Diagnosis not present

## 2017-05-09 DIAGNOSIS — M9906 Segmental and somatic dysfunction of lower extremity: Secondary | ICD-10-CM | POA: Diagnosis not present

## 2017-05-09 DIAGNOSIS — M7918 Myalgia, other site: Secondary | ICD-10-CM | POA: Diagnosis not present

## 2017-05-09 DIAGNOSIS — M9903 Segmental and somatic dysfunction of lumbar region: Secondary | ICD-10-CM | POA: Diagnosis not present

## 2017-05-10 LAB — RENAL FUNCTION PANEL
ALBUMIN: 4.1 g/dL (ref 3.5–5.5)
BUN/Creatinine Ratio: 20 (ref 9–23)
BUN: 13 mg/dL (ref 6–24)
CO2: 21 mmol/L (ref 20–29)
Calcium: 9.4 mg/dL (ref 8.7–10.2)
Chloride: 103 mmol/L (ref 96–106)
Creatinine, Ser: 0.64 mg/dL (ref 0.57–1.00)
GFR, EST AFRICAN AMERICAN: 121 mL/min/{1.73_m2} (ref >59)
GFR, EST NON AFRICAN AMERICAN: 105 mL/min/{1.73_m2} (ref >59)
GLUCOSE: 104 mg/dL — AB (ref 65–99)
PHOSPHORUS: 3.5 mg/dL (ref 2.5–4.5)
POTASSIUM: 4.4 mmol/L (ref 3.5–5.2)
SODIUM: 139 mmol/L (ref 134–144)

## 2017-05-10 LAB — LIPID PANEL
CHOL/HDL RATIO: 5.3 ratio — AB (ref 0.0–4.4)
Cholesterol, Total: 217 mg/dL — ABNORMAL HIGH (ref 100–199)
HDL: 41 mg/dL (ref >39)
LDL Calculated: 127 mg/dL — ABNORMAL HIGH (ref 0–99)
TRIGLYCERIDES: 246 mg/dL — AB (ref 0–149)
VLDL Cholesterol Cal: 49 mg/dL — ABNORMAL HIGH (ref 5–40)

## 2017-05-15 MED FILL — SERTRALINE HCL 100 MG TAB: 100 | 30 days supply | Qty: 30 | Fill #0

## 2017-05-15 MED FILL — CLOBETASOL PROPIONATE 0.05: 0.05 | 30 days supply | Qty: 30 | Fill #0

## 2017-05-16 DIAGNOSIS — M9906 Segmental and somatic dysfunction of lower extremity: Secondary | ICD-10-CM | POA: Diagnosis not present

## 2017-05-16 DIAGNOSIS — M4607 Spinal enthesopathy, lumbosacral region: Secondary | ICD-10-CM | POA: Diagnosis not present

## 2017-05-16 DIAGNOSIS — M722 Plantar fascial fibromatosis: Secondary | ICD-10-CM | POA: Diagnosis not present

## 2017-05-16 DIAGNOSIS — M7918 Myalgia, other site: Secondary | ICD-10-CM | POA: Diagnosis not present

## 2017-05-16 DIAGNOSIS — M9903 Segmental and somatic dysfunction of lumbar region: Secondary | ICD-10-CM | POA: Diagnosis not present

## 2017-05-29 DIAGNOSIS — F4323 Adjustment disorder with mixed anxiety and depressed mood: Secondary | ICD-10-CM | POA: Diagnosis not present

## 2017-06-06 DIAGNOSIS — M7918 Myalgia, other site: Secondary | ICD-10-CM | POA: Diagnosis not present

## 2017-06-06 DIAGNOSIS — M9906 Segmental and somatic dysfunction of lower extremity: Secondary | ICD-10-CM | POA: Diagnosis not present

## 2017-06-06 DIAGNOSIS — M4607 Spinal enthesopathy, lumbosacral region: Secondary | ICD-10-CM | POA: Diagnosis not present

## 2017-06-06 DIAGNOSIS — M9903 Segmental and somatic dysfunction of lumbar region: Secondary | ICD-10-CM | POA: Diagnosis not present

## 2017-06-26 DIAGNOSIS — F4323 Adjustment disorder with mixed anxiety and depressed mood: Secondary | ICD-10-CM | POA: Diagnosis not present

## 2017-06-29 DIAGNOSIS — F4323 Adjustment disorder with mixed anxiety and depressed mood: Secondary | ICD-10-CM | POA: Diagnosis not present

## 2017-07-11 DIAGNOSIS — M9903 Segmental and somatic dysfunction of lumbar region: Secondary | ICD-10-CM | POA: Diagnosis not present

## 2017-07-11 DIAGNOSIS — M4607 Spinal enthesopathy, lumbosacral region: Secondary | ICD-10-CM | POA: Diagnosis not present

## 2017-07-11 DIAGNOSIS — M9906 Segmental and somatic dysfunction of lower extremity: Secondary | ICD-10-CM | POA: Diagnosis not present

## 2017-07-11 DIAGNOSIS — M7918 Myalgia, other site: Secondary | ICD-10-CM | POA: Diagnosis not present

## 2017-07-11 DIAGNOSIS — M722 Plantar fascial fibromatosis: Secondary | ICD-10-CM | POA: Diagnosis not present

## 2017-08-16 ENCOUNTER — Ambulatory Visit
Admission: EM | Admit: 2017-08-16 | Discharge: 2017-08-16 | Disposition: A | Discharge status: Home / Self Care | Payer: BLUE CROSS/BLUE SHIELD | Attending: Family Medicine | Admitting: Family Medicine

## 2017-08-16 ENCOUNTER — Encounter: Payer: Self-pay | Admitting: Emergency Medicine

## 2017-08-16 ENCOUNTER — Other Ambulatory Visit: Payer: Self-pay

## 2017-08-16 ENCOUNTER — Ambulatory Visit (INDEPENDENT_AMBULATORY_CARE_PROVIDER_SITE_OTHER): Payer: BLUE CROSS/BLUE SHIELD

## 2017-08-16 DIAGNOSIS — S6991XA Unspecified injury of right wrist, hand and finger(s), initial encounter: Secondary | ICD-10-CM

## 2017-08-16 NOTE — Discharge Instructions (Signed)
No fractures on xray  Use anti-inflammatories for pain/swelling. You may take up to 800 mg Ibuprofen every 8 hours with food. You may supplement Ibuprofen with Tylenol [phone] mg every 8 hours.   Apply Ice and wear splint  [facility]- [facility] a walk-in clinic

## 2017-08-16 NOTE — ED Triage Notes (Signed)
Patient in today c/o right hand pain since yesterday. Patient was carrying a heavy piece of tile and dropped it and tried to catch it from the top and sprained her hand.

## 2017-08-16 NOTE — ED Provider Notes (Signed)
[facility]    CSN: [id number] Arrival date & time: 08/16/17  1937     History   Chief Complaint Chief Complaint  Patient presents with  . Hand Pain    right    HPI Valerie Underwood is a 47 y.o. female history of TBI and seizures, presenting today for evaluation of right hand/finger pain.  Patient states that she has been moving, she was carrying a large tile from the basement and the tile began to slip, she grabbed the top of it with her right hand and felt a pop.  Since she has had significant pain and swelling near her second and third finger, majority of pain is near base of second finger.  Stating that she is having difficulty bending and straightening her finger.  HPI  Past Medical History:  Diagnosis Date  . Anemia   . Depression   . Eczema   . History of traumatic brain injury 1997  . Seizures ([place])    hx- after TBI, last sz 2001  . Uterine fibroid    history    Patient Active Problem List   Diagnosis Date Noted  . Depression, recurrent ([place]) 05/02/2017  . Family history of PMS (premenstrual syndrome) 05/17/2016  . BMI 40.0-44.9, adult ([place]) 05/17/2016  . Metatarsalgia 05/24/2011  . Gait abnormality 05/24/2011  . Hypertriglyceridemia 01/18/2011  . Morbid obesity ([place]) 01/17/2011  . Depression 01/17/2011  . Anemia   . Uterine fibroid   . History of traumatic brain injury   . Seizures ([place])   . Left foot pain 11/11/2010    Past Surgical History:  Procedure Laterality Date  . DILATION AND CURETTAGE OF UTERUS  1988  . UTERINE ARTERY EMBOLIZATION  2009    OB History    Gravida  1   Para      Term      Preterm      AB  1   Living        SAB  1   TAB      Ectopic      Multiple      Live Births               Home Medications    Prior to Admission medications   Medication Sig Start Date End Date Taking? Authorizing Provider  clobetasol cream (TEMOVATE) 9.02 % Apply 1 application topically 2 (two) times daily. 05/02/17   Yes Juline Patch, MD  Multiple Vitamin (MULTIVITAMIN) tablet Take 1 tablet by mouth daily.     Yes [provider]  sertraline (ZOLOFT) 100 MG tablet Take 1 tablet (100 mg total) by mouth daily. PRN for menstrual cycle 05/02/17  Yes Juline Patch, MD    Family History Family History  Adopted: Yes  Problem Relation Age of Onset  . Stroke Mother   . Hypertension Father   . Diabetes Father   . Cancer Maternal Grandmother     Social History Social History   Tobacco Use  . Smoking status: Never Smoker  . Smokeless tobacco: Never Used  Substance Use Topics  . Alcohol use: Yes    Alcohol/week: 2.4 oz    Types: 4 Glasses of wine per week  . Drug use: No     Allergies   Prednisone; Statins; and Benadryl [diphenhydramine]   Review of Systems Review of Systems  Constitutional: Negative for activity change, appetite change and fatigue.  Eyes: Negative for visual disturbance.  Respiratory: Negative for shortness of breath.  Cardiovascular: Negative for chest pain.  Gastrointestinal: Negative for nausea and vomiting.  Musculoskeletal: Positive for arthralgias and joint swelling. Negative for back pain, myalgias, neck pain and neck stiffness.  Skin: Negative for color change, pallor and wound.  Neurological: Positive for weakness. Negative for dizziness, light-headedness, numbness and headaches.     Physical Exam Triage Vital Signs ED Triage Vitals  Enc Vitals Group     BP 08/16/17 0833 (!) 174/106     Pulse Rate 08/16/17 0833 73     Resp 08/16/17 0833 16     Temp 08/16/17 0833 98.7 F (37.1 C)     Temp src --      SpO2 08/16/17 0833 99 %     Weight 08/16/17 0832 (!) 310 lb (140.6 kg)     Height 08/16/17 0832 5\' 8"  (1.727 m)     Head Circumference --      Peak Flow --      Pain Score 08/16/17 0832 8     Pain Loc --      Pain Edu? --      Excl. in [place]? --    No data found.  Updated Vital Signs BP (!) 174/106 (BP Location: Left Arm)   Pulse 73   Temp  98.7 F (37.1 C)   Resp 16   Ht 5\' 8"  (1.727 m)   Wt (!) 310 lb (140.6 kg)   LMP 06/16/2017 (Approximate)   SpO2 99%   BMI 47.14 kg/m   Visual Acuity Right Eye Distance:   Left Eye Distance:   Bilateral Distance:    Right Eye Near:   Left Eye Near:    Bilateral Near:     Physical Exam  Constitutional: She is oriented to person, place, and time. She appears well-developed and well-nourished.  No acute distress  HENT:  Head: Normocephalic and atraumatic.  Nose: Nose normal.  Eyes: Conjunctivae are normal.  Neck: Neck supple.  Cardiovascular: Normal rate.  Pulmonary/Chest: Effort normal. No respiratory distress.  Abdominal: She exhibits no distension.  Musculoskeletal: Normal range of motion.  Right hand:, Moderate swelling to second finger as well as distal second MCP, patient holding fingers slightly flexed, nontender to palpation of distal radius and ulna, nontender to palpation over carpals or anatomical snuffbox, nontender to palpation of the first, third through fifth metacarpal, tenderness to palpation of distal second metacarpal, significant tenderness with passive flexion and extension at second MCP and DIP.  Neurological: She is alert and oriented to person, place, and time.  Skin: Skin is warm and dry.  Psychiatric: She has a normal mood and affect.  Nursing note and vitals reviewed.    UC Treatments / Results  Labs (all labs ordered are listed, but only abnormal results are displayed) Labs Reviewed - No data to display  EKG None  Radiology Dg Finger Index Right  Result Date: 08/16/2017 CLINICAL DATA:  Injury following heavy lifting, initial encounter EXAM: RIGHT INDEX FINGER 2+V COMPARISON:  None. FINDINGS: There is no evidence of fracture or dislocation. There is no evidence of arthropathy or other focal bone abnormality. Soft tissues are unremarkable. IMPRESSION: No acute abnormality noted. Electronically Signed   By: Inez Catalina M.D.   On: 08/16/2017  08:58    Procedures Procedures (including critical care time)  Medications Ordered in UC Medications - No data to display  Initial Impression / Assessment and Plan / UC Course  I have reviewed the triage vital signs and the nursing notes.  Pertinent labs &  imaging results that were available during my care of the patient were reviewed by me and considered in my medical decision making (see chart for details).     No fracture on x-ray, exam concerning for possible reduction of finger extensor.  Will apply static splint for support, anti-inflammatories, ice.  Follow-up with orthopedics for further evaluation.Discussed strict return precautions. Patient verbalized understanding and is agreeable with plan.  Final Clinical Impressions(s) / UC Diagnoses   Final diagnoses:  Injury of index finger, right, initial encounter     Discharge Instructions     No fractures on xray  Use anti-inflammatories for pain/swelling. You may take up to 800 mg Ibuprofen every 8 hours with food. You may supplement Ibuprofen with Tylenol [phone] mg every 8 hours.   Apply Ice and wear splint  [facility]- [facility] have a walk-in clinic     ED Prescriptions    None     Controlled Substance Prescriptions [place] Controlled Substance Registry consulted? Not Applicable   Janith Lima, [place] 08/16/17 [phone]

## 2017-08-24 MED FILL — SERTRALINE HCL 100 MG TAB: 100 | 30 days supply | Qty: 30 | Fill #1

## 2017-08-28 DIAGNOSIS — M50222 Other cervical disc displacement at C5-C6 level: Secondary | ICD-10-CM | POA: Diagnosis not present

## 2017-08-28 DIAGNOSIS — M531 Cervicobrachial syndrome: Secondary | ICD-10-CM | POA: Diagnosis not present

## 2017-08-28 DIAGNOSIS — G5602 Carpal tunnel syndrome, left upper limb: Secondary | ICD-10-CM | POA: Diagnosis not present

## 2017-08-28 DIAGNOSIS — G5601 Carpal tunnel syndrome, right upper limb: Secondary | ICD-10-CM | POA: Diagnosis not present

## 2017-08-31 DIAGNOSIS — M50222 Other cervical disc displacement at C5-C6 level: Secondary | ICD-10-CM | POA: Diagnosis not present

## 2017-08-31 DIAGNOSIS — G5602 Carpal tunnel syndrome, left upper limb: Secondary | ICD-10-CM | POA: Diagnosis not present

## 2017-08-31 DIAGNOSIS — M531 Cervicobrachial syndrome: Secondary | ICD-10-CM | POA: Diagnosis not present

## 2017-08-31 DIAGNOSIS — G5601 Carpal tunnel syndrome, right upper limb: Secondary | ICD-10-CM | POA: Diagnosis not present

## 2017-09-12 DIAGNOSIS — S92314A Nondisplaced fracture of first metatarsal bone, right foot, initial encounter for closed fracture: Secondary | ICD-10-CM | POA: Diagnosis not present

## 2017-10-02 ENCOUNTER — Ambulatory Visit: Payer: Self-pay | Admitting: Podiatry

## 2017-10-03 DIAGNOSIS — M4602 Spinal enthesopathy, cervical region: Secondary | ICD-10-CM | POA: Diagnosis not present

## 2017-10-03 DIAGNOSIS — S134XXA Sprain of ligaments of cervical spine, initial encounter: Secondary | ICD-10-CM | POA: Diagnosis not present

## 2017-10-03 DIAGNOSIS — M9901 Segmental and somatic dysfunction of cervical region: Secondary | ICD-10-CM | POA: Diagnosis not present

## 2017-10-03 DIAGNOSIS — S335XXA Sprain of ligaments of lumbar spine, initial encounter: Secondary | ICD-10-CM | POA: Diagnosis not present

## 2017-10-03 DIAGNOSIS — M9907 Segmental and somatic dysfunction of upper extremity: Secondary | ICD-10-CM | POA: Diagnosis not present

## 2017-10-24 DIAGNOSIS — S134XXA Sprain of ligaments of cervical spine, initial encounter: Secondary | ICD-10-CM | POA: Diagnosis not present

## 2017-10-24 DIAGNOSIS — M9901 Segmental and somatic dysfunction of cervical region: Secondary | ICD-10-CM | POA: Diagnosis not present

## 2017-10-24 DIAGNOSIS — M9907 Segmental and somatic dysfunction of upper extremity: Secondary | ICD-10-CM | POA: Diagnosis not present

## 2017-10-24 DIAGNOSIS — M4602 Spinal enthesopathy, cervical region: Secondary | ICD-10-CM | POA: Diagnosis not present

## 2017-10-24 DIAGNOSIS — S335XXA Sprain of ligaments of lumbar spine, initial encounter: Secondary | ICD-10-CM | POA: Diagnosis not present

## 2017-11-18 DIAGNOSIS — K37 Unspecified appendicitis: Secondary | ICD-10-CM | POA: Diagnosis not present

## 2017-11-18 DIAGNOSIS — D259 Leiomyoma of uterus, unspecified: Secondary | ICD-10-CM | POA: Diagnosis not present

## 2017-11-18 DIAGNOSIS — K760 Fatty (change of) liver, not elsewhere classified: Secondary | ICD-10-CM | POA: Diagnosis not present

## 2017-11-18 DIAGNOSIS — R1031 Right lower quadrant pain: Secondary | ICD-10-CM | POA: Diagnosis not present

## 2017-11-18 DIAGNOSIS — R112 Nausea with vomiting, unspecified: Secondary | ICD-10-CM | POA: Diagnosis not present

## 2017-11-22 MED FILL — KETOROLAC 10 MG TABLET: 10 | 5 days supply | Qty: 20 | Fill #0

## 2017-11-22 MED FILL — SERTRALINE HCL 100 MG TAB: 100 | 30 days supply | Qty: 30 | Fill #2

## 2017-11-27 DIAGNOSIS — F4323 Adjustment disorder with mixed anxiety and depressed mood: Secondary | ICD-10-CM | POA: Diagnosis not present

## 2017-12-11 DIAGNOSIS — S335XXA Sprain of ligaments of lumbar spine, initial encounter: Secondary | ICD-10-CM | POA: Diagnosis not present

## 2017-12-11 DIAGNOSIS — S134XXA Sprain of ligaments of cervical spine, initial encounter: Secondary | ICD-10-CM | POA: Diagnosis not present

## 2017-12-11 DIAGNOSIS — M9907 Segmental and somatic dysfunction of upper extremity: Secondary | ICD-10-CM | POA: Diagnosis not present

## 2017-12-11 DIAGNOSIS — M4602 Spinal enthesopathy, cervical region: Secondary | ICD-10-CM | POA: Diagnosis not present

## 2017-12-11 DIAGNOSIS — M9901 Segmental and somatic dysfunction of cervical region: Secondary | ICD-10-CM | POA: Diagnosis not present

## 2017-12-11 DIAGNOSIS — F4323 Adjustment disorder with mixed anxiety and depressed mood: Secondary | ICD-10-CM | POA: Diagnosis not present

## 2018-01-01 MED FILL — CLOBETASOL PROPIONATE 0.05: 0.05 | 30 days supply | Qty: 30 | Fill #1

## 2018-01-08 DIAGNOSIS — F4323 Adjustment disorder with mixed anxiety and depressed mood: Secondary | ICD-10-CM | POA: Diagnosis not present

## 2018-02-19 DIAGNOSIS — F4323 Adjustment disorder with mixed anxiety and depressed mood: Secondary | ICD-10-CM | POA: Diagnosis not present

## 2018-02-24 ENCOUNTER — Ambulatory Visit
Admission: EM | Admit: 2018-02-24 | Discharge: 2018-02-24 | Disposition: A | Discharge status: Home / Self Care | Payer: BLUE CROSS/BLUE SHIELD | Attending: Emergency Medicine | Admitting: Emergency Medicine

## 2018-02-24 ENCOUNTER — Ambulatory Visit (INDEPENDENT_AMBULATORY_CARE_PROVIDER_SITE_OTHER): Payer: BLUE CROSS/BLUE SHIELD

## 2018-02-24 DIAGNOSIS — Z23 Encounter for immunization: Secondary | ICD-10-CM

## 2018-02-24 DIAGNOSIS — S61251A Open bite of left index finger without damage to nail, initial encounter: Secondary | ICD-10-CM

## 2018-02-24 DIAGNOSIS — J069 Acute upper respiratory infection, unspecified: Secondary | ICD-10-CM | POA: Diagnosis not present

## 2018-02-24 DIAGNOSIS — W5501XA Bitten by cat, initial encounter: Secondary | ICD-10-CM | POA: Diagnosis not present

## 2018-02-24 MED ORDER — AMOXICILLIN-POT CLAVULANATE 875-125 MG PO TABS: 1.0000 | ORAL_TABLET | Freq: Two times a day (BID) | ORAL | 0 refills | Status: DC

## 2018-02-24 MED ORDER — BENZONATATE 200 MG PO CAPS: ORAL_CAPSULE | ORAL | 0 refills | Status: DC

## 2018-02-24 MED ORDER — TETANUS-DIPHTH-ACELL PERTUSSIS 5-2.5-18.5 LF-MCG/0.5 IM SUSP
0.5000 mL | Freq: Once | INTRAMUSCULAR | Status: AC
  Administered 2018-02-24: 0.5 mL via INTRAMUSCULAR

## 2018-02-24 MED ORDER — MUPIROCIN 2 % EX OINT: 1.0000 "application " | TOPICAL_OINTMENT | Freq: Three times a day (TID) | CUTANEOUS | 0 refills | Status: DC

## 2018-02-24 MED ORDER — HYDROCOD POLST-CPM POLST ER 10-8 MG/5ML PO SUER: 5.0000 mL | Freq: Two times a day (BID) | ORAL | 0 refills | Status: DC

## 2018-02-24 NOTE — Discharge Instructions (Signed)
Wet a washcloth under the faucet and then place in the microwave oven for 10 to 15 seconds.  Place the cloth over the finger leaving it there for 10 minutes.  Dry the area thoroughly and apply mupirocin ointment to all areas of hardness.  Perform this 3-4 times each and every day until the hardness has resolved.  Be certain to take all of the augmentin.  If the area appears worsening return to our clinic or go to [facility].

## 2018-02-24 NOTE — ED Triage Notes (Signed)
Pt states she was rubbing her cat last night and she has dementia. She states she bit her left index finger and is red and swollen. Looks like a pimple on her finger. States the cat is her own and up to date on her vaccines. Also wants to be treated for a cough as well.

## 2018-02-24 NOTE — ED Provider Notes (Signed)
[facility]    CSN: [id number] Arrival date & time: 02/24/18  1032     History   Chief Complaint Chief Complaint  Patient presents with  . Animal Bite    appointment  . Cough    HPI Valerie Underwood is a 48 y.o. female.   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  First problem is that of a cat bite to her left nondominant index finger over the distal pulp.  She states the cat is old and has dementia bitten her several other times but never this deep.  Noticed that the finger has become red and swollen.  Puncture wound in the middle of the distal pad and another wound just adjacent to the ulnar nail fold.. There is no drainage.  Her second problem is that of a cough that she is had for over a month.  It is mildly productive.  No shortness of breath or wheezing.  She has had no fever or chills.  It is much worse during the nighttime and less so in the daytime.  Mild mucus from her nose but the major problem appears to be the coughing.       Past Medical History:  Diagnosis Date  . Anemia   . Depression   . Eczema   . History of traumatic brain injury 1997  . Seizures ([place])    hx- after TBI, last sz 2001  . Uterine fibroid    history    Patient Active Problem List   Diagnosis Date Noted  . Depression, recurrent ([place]) 05/02/2017  . Family history of PMS (premenstrual syndrome) 05/17/2016  . BMI 40.0-44.9, adult ([place]) 05/17/2016  . Metatarsalgia 05/24/2011  . Gait abnormality 05/24/2011  . Hypertriglyceridemia 01/18/2011  . Morbid obesity ([place]) 01/17/2011  . Depression 01/17/2011  . Anemia   . Uterine fibroid   . History of traumatic brain injury   . Seizures ([place])   . Left foot pain 11/11/2010    Past Surgical History:  Procedure Laterality Date  . DILATION AND CURETTAGE OF UTERUS  1988  . UTERINE ARTERY EMBOLIZATION  2009    OB History    Gravida  1   Para      Term      Preterm      AB  1   Living         SAB  1   TAB      Ectopic      Multiple      Live Births               Home Medications    Prior to Admission medications   Medication Sig Start Date End Date Taking? Authorizing Provider  Multiple Vitamin (MULTIVITAMIN) tablet Take 1 tablet by mouth daily.     Yes [provider]  sertraline (ZOLOFT) 100 MG tablet Take 1 tablet (100 mg total) by mouth daily. PRN for menstrual cycle 05/02/17  Yes Juline Patch, MD  amoxicillin-clavulanate (AUGMENTIN) 875-125 MG tablet Take 1 tablet by mouth every 12 (twelve) hours. 02/24/18   Lorin Picket, PA-C  benzonatate (TESSALON) 200 MG capsule Take one cap TID PRN cough 02/24/18   Lorin Picket, PA-C  chlorpheniramine-HYDROcodone [facility]) 10-8 MG/5ML SUER Take 5 mLs by mouth 2 (two) times daily. 02/24/18   Lorin Picket, PA-C  clobetasol cream (TEMOVATE) 4.40 % Apply 1 application topically 2 (two) times daily. 05/02/17   Ronnald Ramp,  Deanna C, MD  mupirocin ointment (BACTROBAN) 2 % Apply 1 application topically 3 (three) times daily. 02/24/18   Lorin Picket, PA-C    Family History Family History  Adopted: Yes  Problem Relation Age of Onset  . Stroke Mother   . Hypertension Father   . Diabetes Father   . Cancer Maternal Grandmother     Social History Social History   Tobacco Use  . Smoking status: Never Smoker  . Smokeless tobacco: Never Used  Substance Use Topics  . Alcohol use: Yes    Alcohol/week: 4.0 standard drinks    Types: 4 Glasses of wine per week  . Drug use: No     Allergies   Prednisone; Statins; and Benadryl [diphenhydramine]   Review of Systems Review of Systems  Constitutional: Positive for activity change. Negative for appetite change, chills, fatigue and fever.  HENT: Positive for congestion.   Respiratory: Positive for cough. Negative for shortness of breath, wheezing and stridor.   Skin: Positive for wound.  All other systems reviewed and are  negative.    Physical Exam Triage Vital Signs ED Triage Vitals  Enc Vitals Group     BP 02/24/18 1049 (!) 145/84     Pulse Rate 02/24/18 1049 75     Resp 02/24/18 1049 18     Temp 02/24/18 1049 98.3 F (36.8 C)     Temp Source 02/24/18 1049 Oral     SpO2 02/24/18 1049 97 %     Weight 02/24/18 1052 (!) 310 lb (140.6 kg)     Height 02/24/18 1052 5\' 8"  (1.727 m)     Head Circumference --      Peak Flow --      Pain Score 02/24/18 1052 6     Pain Loc --      Pain Edu? --      Excl. in [place]? --    No data found.  Updated Vital Signs BP (!) 145/84 (BP Location: Left Arm)   Pulse 75   Temp 98.3 F (36.8 C) (Oral)   Resp 18   Ht 5\' 8"  (1.727 m)   Wt (!) 310 lb (140.6 kg)   SpO2 97%   BMI 47.14 kg/m   Visual Acuity Right Eye Distance:   Left Eye Distance:   Bilateral Distance:    Right Eye Near:   Left Eye Near:    Bilateral Near:     Physical Exam Vitals signs and nursing note reviewed.  Constitutional:      General: She is not in acute distress.    Appearance: Normal appearance. She is obese. She is not ill-appearing, toxic-appearing or diaphoretic.  HENT:     Head: Normocephalic.     Nose: Nose normal. No congestion or rhinorrhea.     Mouth/Throat:     Mouth: Mucous membranes are moist.     Pharynx: No oropharyngeal exudate or posterior oropharyngeal erythema.  Eyes:     General:        Right eye: No discharge.        Left eye: No discharge.     Conjunctiva/sclera: Conjunctivae normal.  Neck:     Musculoskeletal: Normal range of motion.  Pulmonary:     Effort: Pulmonary effort is normal.     Breath sounds: Normal breath sounds.  Musculoskeletal: Normal range of motion.  Skin:    General: Skin is warm and dry.     Comments: Examination of the left nondominant index finger shows a puncture  wound at the midportion of the distal pulp.  Is a corresponding wound over the nail bed edge ulnar aspect.  The pulp is very tense and tender.  There is mild erythema  present.  No injury to the nail itself.  No drainage present.  Neurological:     General: No focal deficit present.     Mental Status: She is alert and oriented to person, place, and time.  Psychiatric:        Mood and Affect: Mood normal.        Behavior: Behavior normal.        Thought Content: Thought content normal.        Judgment: Judgment normal.      UC Treatments / Results  Labs (all labs ordered are listed, but only abnormal results are displayed) Labs Reviewed - No data to display  EKG None  Radiology Dg Finger Index Left  Result Date: 02/24/2018 CLINICAL DATA:  Patient with bite to the distal second finger. Initial encounter. EXAM: LEFT INDEX FINGER 2+V COMPARISON:  None. FINDINGS: Normal anatomic alignment. No evidence for acute fracture dislocation. No radiopaque foreign body. IMPRESSION: No acute osseous abnormality. Electronically Signed   By: Lovey Newcomer M.D.   On: 02/24/2018 12:06    Procedures Procedures (including critical care time)  Medications Ordered in UC Medications  Tdap (BOOSTRIX) injection 0.5 mL (has no administration in time range)    Initial Impression / Assessment and Plan / UC Course  I have reviewed the triage vital signs and the nursing notes.  Pertinent labs & imaging results that were available during my care of the patient were reviewed by me and considered in my medical decision making (see chart for details).  Treat her upper respiratory infection with suppressants.  Prescribed Augmentin for the cat bite that she will take twice a day for 5 days.  I also place her on warm compresses call.  As of the possibility of this becoming a felon she will keep close eye on it and go to orthopedic surgery if that does occur or if it is not improving.  Received her tetanus toxoid before leaving the facility.   Final Clinical Impressions(s) / UC Diagnoses   Final diagnoses:  Cat bite, initial encounter  Acute upper respiratory infection      Discharge Instructions     Wet a washcloth under the faucet and then place in the microwave oven for 10 to 15 seconds.  Place the cloth over the finger leaving it there for 10 minutes.  Dry the area thoroughly and apply mupirocin ointment to all areas of hardness.  Perform this 3-4 times each and every day until the hardness has resolved.  Be certain to take all of the augmentin.  If the area appears worsening return to our clinic or go to [facility].    ED Prescriptions    Medication Sig Dispense Auth. Provider   amoxicillin-clavulanate (AUGMENTIN) 875-125 MG tablet Take 1 tablet by mouth every 12 (twelve) hours. 10 tablet Crecencio Mc P, PA-C   mupirocin ointment (BACTROBAN) 2 % Apply 1 application topically 3 (three) times daily. 22 g Crecencio Mc P, PA-C   benzonatate (TESSALON) 200 MG capsule Take one cap TID PRN cough 30 capsule Lorin Picket, PA-C   chlorpheniramine-HYDROcodone (TUSSIONEX PENNKINETIC ER) 10-8 MG/5ML SUER Take 5 mLs by mouth 2 (two) times daily. 115 mL Lorin Picket, PA-C     Controlled Substance Prescriptions [place] Controlled Substance Registry consulted? Not Applicable  Lorin Picket, PA-C 02/24/18 1235

## 2018-02-26 MED FILL — SERTRALINE HCL 100 MG TAB: 100 | 30 days supply | Qty: 30 | Fill #3

## 2018-02-27 DIAGNOSIS — M542 Cervicalgia: Secondary | ICD-10-CM | POA: Diagnosis not present

## 2018-02-27 DIAGNOSIS — M9907 Segmental and somatic dysfunction of upper extremity: Secondary | ICD-10-CM | POA: Diagnosis not present

## 2018-02-27 DIAGNOSIS — Z0000 Encounter for general adult medical examination without abnormal findings: Secondary | ICD-10-CM | POA: Diagnosis not present

## 2018-02-27 DIAGNOSIS — Z7689 Persons encountering health services in other specified circumstances: Secondary | ICD-10-CM | POA: Diagnosis not present

## 2018-02-27 DIAGNOSIS — Z6841 Body Mass Index (BMI) 40.0 and over, adult: Secondary | ICD-10-CM | POA: Diagnosis not present

## 2018-02-27 DIAGNOSIS — M9901 Segmental and somatic dysfunction of cervical region: Secondary | ICD-10-CM | POA: Diagnosis not present

## 2018-03-01 ENCOUNTER — Other Ambulatory Visit (HOSPITAL_BASED_OUTPATIENT_CLINIC_OR_DEPARTMENT_OTHER): Payer: Self-pay | Admitting: Physician Assistant

## 2018-03-01 DIAGNOSIS — Z1231 Encounter for screening mammogram for malignant neoplasm of breast: Secondary | ICD-10-CM

## 2018-03-04 ENCOUNTER — Ambulatory Visit (HOSPITAL_BASED_OUTPATIENT_CLINIC_OR_DEPARTMENT_OTHER)
Admission: RE | Admit: 2018-03-04 | Discharge: 2018-03-04 | Disposition: A | Discharge status: Home / Self Care | Payer: BLUE CROSS/BLUE SHIELD | Source: Ambulatory Visit | Attending: Physician Assistant | Admitting: Physician Assistant

## 2018-03-04 DIAGNOSIS — Z1231 Encounter for screening mammogram for malignant neoplasm of breast: Secondary | ICD-10-CM | POA: Insufficient documentation

## 2018-03-06 DIAGNOSIS — Z0000 Encounter for general adult medical examination without abnormal findings: Secondary | ICD-10-CM | POA: Diagnosis not present

## 2018-03-06 DIAGNOSIS — Z131 Encounter for screening for diabetes mellitus: Secondary | ICD-10-CM | POA: Diagnosis not present

## 2018-03-06 DIAGNOSIS — Z13220 Encounter for screening for lipoid disorders: Secondary | ICD-10-CM | POA: Diagnosis not present

## 2018-03-13 DIAGNOSIS — M9907 Segmental and somatic dysfunction of upper extremity: Secondary | ICD-10-CM | POA: Diagnosis not present

## 2018-03-13 DIAGNOSIS — M9901 Segmental and somatic dysfunction of cervical region: Secondary | ICD-10-CM | POA: Diagnosis not present

## 2018-03-13 DIAGNOSIS — M542 Cervicalgia: Secondary | ICD-10-CM | POA: Diagnosis not present

## 2018-03-13 DIAGNOSIS — M9903 Segmental and somatic dysfunction of lumbar region: Secondary | ICD-10-CM | POA: Diagnosis not present

## 2018-03-19 DIAGNOSIS — F4323 Adjustment disorder with mixed anxiety and depressed mood: Secondary | ICD-10-CM | POA: Diagnosis not present

## 2018-03-19 DIAGNOSIS — M9903 Segmental and somatic dysfunction of lumbar region: Secondary | ICD-10-CM | POA: Diagnosis not present

## 2018-03-19 DIAGNOSIS — M9907 Segmental and somatic dysfunction of upper extremity: Secondary | ICD-10-CM | POA: Diagnosis not present

## 2018-03-19 DIAGNOSIS — M9901 Segmental and somatic dysfunction of cervical region: Secondary | ICD-10-CM | POA: Diagnosis not present

## 2018-03-19 DIAGNOSIS — M542 Cervicalgia: Secondary | ICD-10-CM | POA: Diagnosis not present

## 2018-03-19 DIAGNOSIS — M545 Low back pain: Secondary | ICD-10-CM | POA: Diagnosis not present

## 2018-04-02 DIAGNOSIS — F4323 Adjustment disorder with mixed anxiety and depressed mood: Secondary | ICD-10-CM | POA: Diagnosis not present

## 2018-04-16 DIAGNOSIS — F4323 Adjustment disorder with mixed anxiety and depressed mood: Secondary | ICD-10-CM | POA: Diagnosis not present

## 2018-04-26 DIAGNOSIS — M9901 Segmental and somatic dysfunction of cervical region: Secondary | ICD-10-CM | POA: Diagnosis not present

## 2018-04-26 DIAGNOSIS — M9907 Segmental and somatic dysfunction of upper extremity: Secondary | ICD-10-CM | POA: Diagnosis not present

## 2018-04-26 DIAGNOSIS — M542 Cervicalgia: Secondary | ICD-10-CM | POA: Diagnosis not present

## 2018-04-26 DIAGNOSIS — M9903 Segmental and somatic dysfunction of lumbar region: Secondary | ICD-10-CM | POA: Diagnosis not present

## 2018-04-30 DIAGNOSIS — F4323 Adjustment disorder with mixed anxiety and depressed mood: Secondary | ICD-10-CM | POA: Diagnosis not present

## 2018-05-01 MED FILL — SERTRALINE HCL 100 MG TAB: 100 | 30 days supply | Qty: 30 | Fill #4

## 2018-05-07 DIAGNOSIS — F4323 Adjustment disorder with mixed anxiety and depressed mood: Secondary | ICD-10-CM | POA: Diagnosis not present

## 2018-05-14 DIAGNOSIS — F4323 Adjustment disorder with mixed anxiety and depressed mood: Secondary | ICD-10-CM | POA: Diagnosis not present

## 2018-05-23 DIAGNOSIS — F4323 Adjustment disorder with mixed anxiety and depressed mood: Secondary | ICD-10-CM | POA: Diagnosis not present

## 2018-05-30 DIAGNOSIS — F4323 Adjustment disorder with mixed anxiety and depressed mood: Secondary | ICD-10-CM | POA: Diagnosis not present

## 2018-06-06 DIAGNOSIS — F4323 Adjustment disorder with mixed anxiety and depressed mood: Secondary | ICD-10-CM | POA: Diagnosis not present

## 2018-06-07 DIAGNOSIS — R51 Headache: Secondary | ICD-10-CM | POA: Diagnosis not present

## 2018-06-07 DIAGNOSIS — R42 Dizziness and giddiness: Secondary | ICD-10-CM | POA: Diagnosis not present

## 2018-06-07 DIAGNOSIS — R112 Nausea with vomiting, unspecified: Secondary | ICD-10-CM | POA: Diagnosis not present

## 2018-06-07 DIAGNOSIS — Z20828 Contact with and (suspected) exposure to other viral communicable diseases: Secondary | ICD-10-CM | POA: Diagnosis not present

## 2018-06-13 DIAGNOSIS — F4323 Adjustment disorder with mixed anxiety and depressed mood: Secondary | ICD-10-CM | POA: Diagnosis not present

## 2018-06-14 DIAGNOSIS — F4323 Adjustment disorder with mixed anxiety and depressed mood: Secondary | ICD-10-CM | POA: Diagnosis not present

## 2018-06-20 DIAGNOSIS — F4323 Adjustment disorder with mixed anxiety and depressed mood: Secondary | ICD-10-CM | POA: Diagnosis not present

## 2018-06-27 DIAGNOSIS — F4323 Adjustment disorder with mixed anxiety and depressed mood: Secondary | ICD-10-CM | POA: Diagnosis not present

## 2018-07-04 DIAGNOSIS — F4323 Adjustment disorder with mixed anxiety and depressed mood: Secondary | ICD-10-CM | POA: Diagnosis not present

## 2018-07-09 DIAGNOSIS — F4323 Adjustment disorder with mixed anxiety and depressed mood: Secondary | ICD-10-CM | POA: Diagnosis not present

## 2018-07-18 DIAGNOSIS — F4323 Adjustment disorder with mixed anxiety and depressed mood: Secondary | ICD-10-CM | POA: Diagnosis not present

## 2018-07-25 DIAGNOSIS — F4323 Adjustment disorder with mixed anxiety and depressed mood: Secondary | ICD-10-CM | POA: Diagnosis not present

## 2018-08-01 DIAGNOSIS — F4323 Adjustment disorder with mixed anxiety and depressed mood: Secondary | ICD-10-CM | POA: Diagnosis not present

## 2018-08-06 DIAGNOSIS — M9901 Segmental and somatic dysfunction of cervical region: Secondary | ICD-10-CM | POA: Diagnosis not present

## 2018-08-06 DIAGNOSIS — M9907 Segmental and somatic dysfunction of upper extremity: Secondary | ICD-10-CM | POA: Diagnosis not present

## 2018-08-06 DIAGNOSIS — M542 Cervicalgia: Secondary | ICD-10-CM | POA: Diagnosis not present

## 2018-08-06 DIAGNOSIS — M9903 Segmental and somatic dysfunction of lumbar region: Secondary | ICD-10-CM | POA: Diagnosis not present

## 2018-08-08 DIAGNOSIS — F4323 Adjustment disorder with mixed anxiety and depressed mood: Secondary | ICD-10-CM | POA: Diagnosis not present

## 2018-08-12 MED FILL — SERTRALINE HCL 100 MG TABS: 100 | 30 days supply | Qty: 30 | Fill #0

## 2018-08-13 DIAGNOSIS — S63636A Sprain of interphalangeal joint of right little finger, initial encounter: Secondary | ICD-10-CM | POA: Diagnosis not present

## 2018-08-15 DIAGNOSIS — F4323 Adjustment disorder with mixed anxiety and depressed mood: Secondary | ICD-10-CM | POA: Diagnosis not present

## 2018-08-22 DIAGNOSIS — F4323 Adjustment disorder with mixed anxiety and depressed mood: Secondary | ICD-10-CM | POA: Diagnosis not present

## 2018-08-29 DIAGNOSIS — F4323 Adjustment disorder with mixed anxiety and depressed mood: Secondary | ICD-10-CM | POA: Diagnosis not present

## 2018-09-04 DIAGNOSIS — F4323 Adjustment disorder with mixed anxiety and depressed mood: Secondary | ICD-10-CM | POA: Diagnosis not present

## 2018-09-10 DIAGNOSIS — M25841 Other specified joint disorders, right hand: Secondary | ICD-10-CM | POA: Diagnosis not present

## 2018-09-10 DIAGNOSIS — M25541 Pain in joints of right hand: Secondary | ICD-10-CM | POA: Diagnosis not present

## 2018-09-11 DIAGNOSIS — F4323 Adjustment disorder with mixed anxiety and depressed mood: Secondary | ICD-10-CM | POA: Diagnosis not present

## 2018-09-19 ENCOUNTER — Telehealth: Payer: Self-pay

## 2018-09-19 NOTE — Telephone Encounter (Signed)
Patient has not been seen in over a year. She is due for a CPE or depression follow up.  Thankyou.

## 2018-09-19 NOTE — Telephone Encounter (Signed)
LM to call back.

## 2018-09-20 DIAGNOSIS — F4323 Adjustment disorder with mixed anxiety and depressed mood: Secondary | ICD-10-CM | POA: Diagnosis not present

## 2018-09-23 DIAGNOSIS — R7989 Other specified abnormal findings of blood chemistry: Secondary | ICD-10-CM | POA: Diagnosis not present

## 2018-09-23 DIAGNOSIS — R7303 Prediabetes: Secondary | ICD-10-CM | POA: Diagnosis not present

## 2018-09-23 DIAGNOSIS — E7800 Pure hypercholesterolemia, unspecified: Secondary | ICD-10-CM | POA: Diagnosis not present

## 2018-09-26 DIAGNOSIS — F4323 Adjustment disorder with mixed anxiety and depressed mood: Secondary | ICD-10-CM | POA: Diagnosis not present

## 2018-10-01 DIAGNOSIS — F4323 Adjustment disorder with mixed anxiety and depressed mood: Secondary | ICD-10-CM | POA: Diagnosis not present

## 2018-10-31 DIAGNOSIS — F4323 Adjustment disorder with mixed anxiety and depressed mood: Secondary | ICD-10-CM | POA: Diagnosis not present

## 2018-11-02 DIAGNOSIS — F4323 Adjustment disorder with mixed anxiety and depressed mood: Secondary | ICD-10-CM | POA: Diagnosis not present

## 2018-11-05 DIAGNOSIS — F4323 Adjustment disorder with mixed anxiety and depressed mood: Secondary | ICD-10-CM | POA: Diagnosis not present

## 2018-11-12 DIAGNOSIS — F4323 Adjustment disorder with mixed anxiety and depressed mood: Secondary | ICD-10-CM | POA: Diagnosis not present

## 2018-11-19 DIAGNOSIS — F4323 Adjustment disorder with mixed anxiety and depressed mood: Secondary | ICD-10-CM | POA: Diagnosis not present

## 2018-11-26 DIAGNOSIS — F4323 Adjustment disorder with mixed anxiety and depressed mood: Secondary | ICD-10-CM | POA: Diagnosis not present

## 2018-12-03 DIAGNOSIS — F4323 Adjustment disorder with mixed anxiety and depressed mood: Secondary | ICD-10-CM | POA: Diagnosis not present

## 2018-12-12 DIAGNOSIS — R198 Other specified symptoms and signs involving the digestive system and abdomen: Secondary | ICD-10-CM | POA: Diagnosis not present

## 2018-12-12 DIAGNOSIS — K219 Gastro-esophageal reflux disease without esophagitis: Secondary | ICD-10-CM | POA: Diagnosis not present

## 2018-12-12 DIAGNOSIS — K921 Melena: Secondary | ICD-10-CM | POA: Diagnosis not present

## 2018-12-12 DIAGNOSIS — F4323 Adjustment disorder with mixed anxiety and depressed mood: Secondary | ICD-10-CM | POA: Diagnosis not present

## 2018-12-12 DIAGNOSIS — R7989 Other specified abnormal findings of blood chemistry: Secondary | ICD-10-CM | POA: Diagnosis not present

## 2018-12-12 DIAGNOSIS — R1013 Epigastric pain: Secondary | ICD-10-CM | POA: Diagnosis not present

## 2018-12-24 NOTE — Telephone Encounter (Signed)
Pt changed provider, now being seen at [facility]

## 2019-04-28 ENCOUNTER — Other Ambulatory Visit: Payer: Self-pay | Admitting: Physician Assistant

## 2019-04-28 DIAGNOSIS — Z1231 Encounter for screening mammogram for malignant neoplasm of breast: Secondary | ICD-10-CM

## 2019-05-07 ENCOUNTER — Other Ambulatory Visit: Payer: Self-pay

## 2019-05-07 ENCOUNTER — Ambulatory Visit
Admission: RE | Admit: 2019-05-07 | Discharge: 2019-05-07 | Disposition: A | Discharge status: Home / Self Care | Payer: BC Managed Care – PPO | Source: Ambulatory Visit | Attending: Physician Assistant | Admitting: Physician Assistant

## 2019-05-07 DIAGNOSIS — Z1231 Encounter for screening mammogram for malignant neoplasm of breast: Secondary | ICD-10-CM | POA: Insufficient documentation

## 2019-08-07 ENCOUNTER — Telehealth: Payer: BC Managed Care – PPO

## 2019-08-12 ENCOUNTER — Other Ambulatory Visit: Payer: Self-pay

## 2019-08-12 DIAGNOSIS — Z1211 Encounter for screening for malignant neoplasm of colon: Secondary | ICD-10-CM

## 2019-08-13 ENCOUNTER — Other Ambulatory Visit: Payer: Self-pay

## 2019-08-13 ENCOUNTER — Telehealth (INDEPENDENT_AMBULATORY_CARE_PROVIDER_SITE_OTHER): Payer: BC Managed Care – PPO | Admitting: Gastroenterology

## 2019-08-13 DIAGNOSIS — Z1211 Encounter for screening for malignant neoplasm of colon: Secondary | ICD-10-CM

## 2019-08-13 MED ORDER — SUTAB 1479-225-188 MG PO TABS: 12.0000 | ORAL_TABLET | Freq: Two times a day (BID) | ORAL | 0 refills | Status: DC

## 2019-08-13 NOTE — Progress Notes (Signed)
Gastroenterology Pre-Procedure Review  Request Date: 09/16/2019 Requesting Physician: Dr. Allen Norris   PATIENT REVIEW QUESTIONS: The patient responded to the following health history questions as indicated:    1. Are you having any GI issues? no 2. Do you have a personal history of Polyps? no 3. Do you have a family history of Colon Cancer or Polyps? No not that she knows of. She states she is adopt  4. Diabetes Mellitus? no 5. Joint replacements in the past 12 months?no 6. Major health problems in the past 3 months?no 7. Any artificial heart valves, MVP, or defibrillator?no    MEDICATIONS & ALLERGIES:    Patient reports the following regarding taking any anticoagulation/antiplatelet therapy:   Plavix, Coumadin, Eliquis, Xarelto, Lovenox, Pradaxa, Brilinta, or Effient? no Aspirin? no  Patient confirms/reports the following medications:  Current Outpatient Medications  Medication Sig Dispense Refill  . amoxicillin-clavulanate (AUGMENTIN) 875-125 MG tablet Take 1 tablet by mouth every 12 (twelve) hours. 10 tablet 0  . benzonatate (TESSALON) 200 MG capsule Take one cap TID PRN cough 30 capsule 0  . chlorpheniramine-HYDROcodone (TUSSIONEX PENNKINETIC ER) 10-8 MG/5ML SUER Take 5 mLs by mouth 2 (two) times daily. 115 mL 0  . clobetasol cream (TEMOVATE) 1.85 % Apply 1 application topically 2 (two) times daily. 30 g 11  . EPINEPHrine 0.3 mg/0.3 mL IJ SOAJ injection SMARTSIG:1 Pre-Filled Pen Syringe IM    . fluticasone (FLONASE) 50 MCG/ACT nasal spray Place 2 sprays into both nostrils daily.    Marland Kitchen ketorolac (TORADOL) 10 MG tablet ketorolac 10 mg tablet    . meclizine (ANTIVERT) 25 MG tablet meclizine 25 mg tablet    . Multiple Vitamin (MULTIVITAMIN) tablet Take 1 tablet by mouth daily.      . mupirocin ointment (BACTROBAN) 2 % Apply 1 application topically 3 (three) times daily. 22 g 0  . pantoprazole (PROTONIX) 40 MG tablet Take 40 mg by mouth daily.    . Phendimetrazine Tartrate 35 MG TABS Take 1  tablet by mouth 3 (three) times daily.    . predniSONE (STERAPRED UNI-PAK 48 TAB) 10 MG (48) TBPK tablet Take by mouth as directed.    . sertraline (ZOLOFT) 100 MG tablet Take 1 tablet (100 mg total) by mouth daily. PRN for menstrual cycle 90 tablet 1   No current facility-administered medications for this visit.    Patient confirms/reports the following allergies:  Allergies  Allergen Reactions  . Prednisone Other (See Comments)    "crazy"  . Statins Other (See Comments)    Body aches  . Benadryl [Diphenhydramine] Palpitations    IV only-can take oral    No orders of the defined types were placed in this encounter.   AUTHORIZATION INFORMATION Primary Insurance: 1D#: Group #:  Secondary Insurance: 1D#: Group #:  SCHEDULE INFORMATION: Date:  Time: Location:

## 2019-09-12 ENCOUNTER — Other Ambulatory Visit
Admission: RE | Admit: 2019-09-12 | Discharge: 2019-09-12 | Disposition: A | Discharge status: Home / Self Care | Payer: BC Managed Care – PPO | Source: Ambulatory Visit | Attending: Gastroenterology | Admitting: Gastroenterology

## 2019-09-12 ENCOUNTER — Other Ambulatory Visit: Payer: Self-pay

## 2019-09-12 DIAGNOSIS — Z01812 Encounter for preprocedural laboratory examination: Secondary | ICD-10-CM | POA: Insufficient documentation

## 2019-09-12 DIAGNOSIS — Z20822 Contact with and (suspected) exposure to covid-19: Secondary | ICD-10-CM | POA: Insufficient documentation

## 2019-09-12 LAB — SARS CORONAVIRUS 2 (TAT 6-24 HRS): SARS Coronavirus 2: NEGATIVE

## 2019-09-15 ENCOUNTER — Telehealth: Payer: Self-pay

## 2019-09-15 ENCOUNTER — Encounter: Payer: Self-pay | Admitting: Gastroenterology

## 2019-09-15 NOTE — Telephone Encounter (Signed)
Patient Valerie Underwood for advice regarding nausea.  She stated that the low fiber diet has made her nauseous.  She vomited at work and was asked to go home.  She said she feels fine otherwise.  She wanted to know if she could take Zofran that she has at home to help with her nausea.  I Valerie Underwood advising her that she could take the Zofran to help with her nausea. She said otherwise she doesn't think she would be able to get the SuTabs down for her procedure.  Her colonoscopy is scheduled for tomorrow with Dr. Allen Norris at [facility].  Thanks,  [place], [place]

## 2019-09-16 ENCOUNTER — Ambulatory Visit: Payer: BC Managed Care – PPO | Admitting: Certified Registered"

## 2019-09-16 ENCOUNTER — Other Ambulatory Visit: Payer: Self-pay

## 2019-09-16 ENCOUNTER — Encounter: Payer: Self-pay | Admitting: Gastroenterology

## 2019-09-16 ENCOUNTER — Encounter
Admission: RE | Disposition: A | Discharge status: Home / Self Care | Payer: Self-pay | Source: Home / Self Care | Attending: Gastroenterology

## 2019-09-16 ENCOUNTER — Ambulatory Visit
Admission: RE | Admit: 2019-09-16 | Discharge: 2019-09-16 | Disposition: A | Discharge status: Home / Self Care | Payer: BC Managed Care – PPO | Attending: Gastroenterology | Admitting: Gastroenterology

## 2019-09-16 DIAGNOSIS — K635 Polyp of colon: Secondary | ICD-10-CM

## 2019-09-16 DIAGNOSIS — Z79899 Other long term (current) drug therapy: Secondary | ICD-10-CM | POA: Diagnosis not present

## 2019-09-16 DIAGNOSIS — K640 First degree hemorrhoids: Secondary | ICD-10-CM | POA: Diagnosis not present

## 2019-09-16 DIAGNOSIS — F329 Major depressive disorder, single episode, unspecified: Secondary | ICD-10-CM | POA: Insufficient documentation

## 2019-09-16 DIAGNOSIS — Z888 Allergy status to other drugs, medicaments and biological substances status: Secondary | ICD-10-CM | POA: Insufficient documentation

## 2019-09-16 DIAGNOSIS — Z1211 Encounter for screening for malignant neoplasm of colon: Secondary | ICD-10-CM | POA: Insufficient documentation

## 2019-09-16 DIAGNOSIS — Z6841 Body Mass Index (BMI) 40.0 and over, adult: Secondary | ICD-10-CM | POA: Insufficient documentation

## 2019-09-16 DIAGNOSIS — Z87820 Personal history of traumatic brain injury: Secondary | ICD-10-CM | POA: Diagnosis not present

## 2019-09-16 DIAGNOSIS — D123 Benign neoplasm of transverse colon: Secondary | ICD-10-CM | POA: Diagnosis not present

## 2019-09-16 HISTORY — PX: COLONOSCOPY WITH PROPOFOL: SHX5780

## 2019-09-16 LAB — POCT PREGNANCY, URINE: Preg Test, Ur: NEGATIVE

## 2019-09-16 SURGERY — COLONOSCOPY WITH PROPOFOL
Anesthesia: General

## 2019-09-16 MED ORDER — LIDOCAINE HCL (CARDIAC) PF 100 MG/5ML IV SOSY
PREFILLED_SYRINGE | INTRAVENOUS | Status: DC | PRN
  Administered 2019-09-16: 100 mg via INTRAVENOUS

## 2019-09-16 MED ORDER — MIDAZOLAM HCL 2 MG/2ML IJ SOLN
INTRAMUSCULAR | Status: AC
  Filled 2019-09-16: qty 2

## 2019-09-16 MED ORDER — PROPOFOL 10 MG/ML IV BOLUS
INTRAVENOUS | Status: DC | PRN
  Administered 2019-09-16: 10 mg via INTRAVENOUS

## 2019-09-16 MED ORDER — GLYCOPYRROLATE 0.2 MG/ML IJ SOLN
INTRAMUSCULAR | Status: DC | PRN
  Administered 2019-09-16: .2 mg via INTRAVENOUS

## 2019-09-16 MED ORDER — LABETALOL HCL 5 MG/ML IV SOLN
INTRAVENOUS | Status: AC
  Filled 2019-09-16: qty 4

## 2019-09-16 MED ORDER — SODIUM CHLORIDE 0.9 % IV SOLN: INTRAVENOUS | Status: DC

## 2019-09-16 MED ORDER — ESMOLOL HCL 100 MG/10ML IV SOLN
INTRAVENOUS | Status: AC
  Filled 2019-09-16: qty 10

## 2019-09-16 MED ORDER — MIDAZOLAM HCL 2 MG/2ML IJ SOLN
INTRAMUSCULAR | Status: DC | PRN
  Administered 2019-09-16 (×2): 1 mg via INTRAVENOUS

## 2019-09-16 MED ORDER — PHENYLEPHRINE HCL (PRESSORS) 10 MG/ML IV SOLN
INTRAVENOUS | Status: AC
  Filled 2019-09-16: qty 1

## 2019-09-16 MED ORDER — PROPOFOL 500 MG/50ML IV EMUL
INTRAVENOUS | Status: DC | PRN
  Administered 2019-09-16: 165 ug/kg/min via INTRAVENOUS

## 2019-09-16 NOTE — Transfer of Care (Signed)
Immediate Anesthesia Transfer of Care Note  Patient: Valerie Underwood  Procedure(s) Performed: COLONOSCOPY WITH PROPOFOL (N/A )  Patient Location: Endoscopy Unit  Anesthesia Type:General  Level of Consciousness: awake, drowsy and responds to stimulation  Airway & Oxygen Therapy: Patient Spontanous Breathing and Patient connected to face mask oxygen  Post-op Assessment: Report given to RN and Post -op Vital signs reviewed and stable  Post vital signs: Reviewed and stable  Last Vitals:  Vitals Value Taken Time  BP 102/52 09/16/19 1000  Temp    Pulse 77 09/16/19 1001  Resp 17 09/16/19 1001  SpO2 100 % 09/16/19 1001  Vitals shown include unvalidated device data.  Last Pain:  Vitals:   09/16/19 0950  TempSrc: Temporal  PainSc:          Complications: No complications documented.

## 2019-09-16 NOTE — Anesthesia Procedure Notes (Signed)
Procedure Name: General with mask airway Performed by: Fletcher-Harrison, Artin Mceuen, CRNA Pre-anesthesia Checklist: Patient identified, Emergency Drugs available, Suction available and Patient being monitored Patient Re-evaluated:Patient Re-evaluated prior to induction Oxygen Delivery Method: Simple face mask Induction Type: IV induction Placement Confirmation: CO2 detector and positive ETCO2 Dental Injury: Teeth and Oropharynx as per pre-operative assessment        

## 2019-09-16 NOTE — H&P (Signed)
Lucilla Lame, MD [address] [place], [place] [address] Phone: [phone] Fax : [phone]  Primary Care Physician:  Marinda Elk, MD Primary Gastroenterologist:  Dr. Allen Norris  Pre-Procedure History & Physical: HPI:  Valerie Underwood is a 49 y.o. female is here for a screening colonoscopy.   Past Medical History:  Diagnosis Date  . Anemia   . Depression   . Eczema   . History of traumatic brain injury 1997  . Seizures ([place])    hx- after TBI, last sz 2001  . Uterine fibroid    history    Past Surgical History:  Procedure Laterality Date  . DILATION AND CURETTAGE OF UTERUS  1988  . UTERINE ARTERY EMBOLIZATION  2009    Prior to Admission medications   Medication Sig Start Date End Date Taking? Authorizing Provider  clobetasol cream (TEMOVATE) 7.03 % Apply 1 application topically 2 (two) times daily. 05/02/17  Yes Juline Patch, MD  fluticasone (FLONASE) 50 MCG/ACT nasal spray Place 2 sprays into both nostrils daily. 06/25/19  Yes [provider]  ketorolac (TORADOL) 10 MG tablet ketorolac 10 mg tablet   Yes [provider]  pantoprazole (PROTONIX) 40 MG tablet Take 40 mg by mouth daily. 06/27/19  Yes [provider]  Phendimetrazine Tartrate 35 MG TABS Take 1 tablet by mouth 3 (three) times daily. 07/08/19  Yes [provider]  sertraline (ZOLOFT) 100 MG tablet Take 1 tablet (100 mg total) by mouth daily. PRN for menstrual cycle 05/02/17  Yes Juline Patch, MD  Sodium Sulfate-Mag Sulfate-KCl (SUTAB) [phone] MG TABS Take 12 tablets by mouth in the morning and at bedtime. At 5pm take 12 tablets and then 5 hours prior to colonoscopy take the other 12. 08/13/19  Yes Lucilla Lame, MD  amoxicillin-clavulanate (AUGMENTIN) 875-125 MG tablet Take 1 tablet by mouth every 12 (twelve) hours. Patient not taking: Reported on 09/16/2019 02/24/18   Lorin Picket, PA-C  benzonatate (TESSALON) 200 MG capsule Take one cap TID PRN  cough Patient not taking: Reported on 09/16/2019 02/24/18   Lorin Picket, PA-C  chlorpheniramine-HYDROcodone [facility] PENNKINETIC ER) 10-8 MG/5ML SUER Take 5 mLs by mouth 2 (two) times daily. Patient not taking: Reported on 09/16/2019 02/24/18   Lorin Picket, PA-C  EPINEPHrine 0.3 mg/0.3 mL IJ SOAJ injection SMARTSIG:1 Pre-Filled Pen Syringe IM 07/26/19   [provider]  meclizine (ANTIVERT) 25 MG tablet meclizine 25 mg tablet Patient not taking: Reported on 09/16/2019    [provider]  Multiple Vitamin (MULTIVITAMIN) tablet Take 1 tablet by mouth daily.      [provider]  mupirocin ointment (BACTROBAN) 2 % Apply 1 application topically 3 (three) times daily. 02/24/18   Lorin Picket, PA-C  predniSONE (STERAPRED UNI-PAK 48 TAB) 10 MG (48) TBPK tablet Take by mouth as directed. Patient not taking: Reported on 09/16/2019 06/25/19   [provider]    Allergies as of 08/13/2019 - Review Complete 02/24/2018  Allergen Reaction Noted  . Prednisone Other (See Comments) 04/30/2016  . Statins Other (See Comments) 10/25/2014  . Benadryl [diphenhydramine] Palpitations 10/25/2014    Family History  Adopted: Yes  Problem Relation Age of Onset  . Stroke Mother   . Hypertension Father   . Diabetes Father   . Cancer Maternal Grandmother   . Breast cancer Maternal Grandmother     Social History   Socioeconomic History  . Marital status: Married    Spouse name: Not on  file  . Number of children: Not on file  . Years of education: Not on file  . Highest education level: Not on file  Occupational History  . Not on file  Tobacco Use  . Smoking status: Never Smoker  . Smokeless tobacco: Never Used  Vaping Use  . Vaping Use: Never used  Substance and Sexual Activity  . Alcohol use: Yes    Alcohol/week: 4.0 standard drinks    Types: 4 Glasses of wine per week  . Drug use: No  . Sexual activity: Yes    Birth control/protection: None  Other  Topics Concern  . Not on file  Social History Narrative  . Not on file   Social Determinants of Health   Financial Resource Strain:   . Difficulty of Paying Living Expenses:   Food Insecurity:   . Worried About Charity fundraiser in the Last Year:   . Arboriculturist in the Last Year:   Transportation Needs:   . Film/video editor (Medical):   Marland Kitchen Lack of Transportation (Non-Medical):   Physical Activity:   . Days of Exercise per Week:   . Minutes of Exercise per Session:   Stress:   . Feeling of Stress :   Social Connections:   . Frequency of Communication with Friends and Family:   . Frequency of Social Gatherings with Friends and Family:   . Attends Religious Services:   . Active Member of Clubs or Organizations:   . Attends Archivist Meetings:   Marland Kitchen Marital Status:   Intimate Partner Violence:   . Fear of Current or Ex-Partner:   . Emotionally Abused:   Marland Kitchen Physically Abused:   . Sexually Abused:     Review of Systems: See HPI, otherwise negative ROS  Physical Exam: BP (!) 147/77   Pulse 73   Temp (!) 96.5 F (35.8 C) (Temporal)   Resp 16   Ht 5\' 8"  (1.727 m)   Wt (!) 140.6 kg   SpO2 98%   BMI 47.14 kg/m  General:   Alert,  pleasant and cooperative in NAD Head:  Normocephalic and atraumatic. Neck:  Supple; no masses or thyromegaly. Lungs:  Clear throughout to auscultation.    Heart:  Regular rate and rhythm. Abdomen:  Soft, nontender and nondistended. Normal bowel sounds, without guarding, and without rebound.   Neurologic:  Alert and  oriented x4;  grossly normal neurologically.  Impression/Plan: Valerie Underwood is now here to undergo a screening colonoscopy.  Risks, benefits, and alternatives regarding colonoscopy have been reviewed with the patient.  Questions have been answered.  All parties agreeable.

## 2019-09-16 NOTE — Op Note (Signed)
[facility] Patient Name: Valerie Underwood Procedure Date: 09/16/2019 9:38 AM MRN: [id number] Account #: [id number] Date of Birth: May 23, 1970 Admit Type: Outpatient Age: 49 Room: [facility] ENDO ROOM 4 Gender: Female Note Status: Finalized Procedure:             Colonoscopy Indications:           Screening for colorectal malignant neoplasm Providers:             Lucilla Lame MD, MD Referring MD:          Precious Bard, MD (Referring MD) Medicines:             Propofol per Anesthesia Complications:         No immediate complications. Procedure:             Pre-Anesthesia Assessment:                        - Prior to the procedure, a History and Physical was                         performed, and patient medications and allergies were                         reviewed. The patient's tolerance of previous                         anesthesia was also reviewed. The risks and benefits                         of the procedure and the sedation options and risks                         were discussed with the patient. All questions were                         answered, and informed consent was obtained. Prior                         Anticoagulants: The patient has taken no previous                         anticoagulant or antiplatelet agents. ASA Grade                         Assessment: II - A patient with mild systemic disease.                         After reviewing the risks and benefits, the patient                         was deemed in satisfactory condition to undergo the                         procedure.                        After obtaining informed consent, the colonoscope was  passed under direct vision. Throughout the procedure,                         the patient's blood pressure, pulse, and oxygen                         saturations were monitored continuously. The                         Colonoscope was introduced through  the anus and                         advanced to the the cecum, identified by appendiceal                         orifice and ileocecal valve. The colonoscopy was                         performed without difficulty. The patient tolerated                         the procedure well. The quality of the bowel                         preparation was excellent. Findings:      The perianal and digital rectal examinations were normal.      A 5 mm polyp was found in the transverse colon. The polyp was sessile.       The polyp was removed with a cold snare. Resection and retrieval were       complete.      Non-bleeding internal hemorrhoids were found during retroflexion. The       hemorrhoids were Grade I (internal hemorrhoids that do not prolapse). Impression:            - One 5 mm polyp in the transverse colon, removed with                         a cold snare. Resected and retrieved.                        - Non-bleeding internal hemorrhoids. Recommendation:        - Discharge patient to home.                        - Resume previous diet.                        - Continue present medications.                        - Await pathology results.                        - Repeat colonoscopy in 7 years if polyp adenoma and                         10 years if hyperplastic Procedure Code(s):     --- Professional ---  45385, Colonoscopy, flexible; with removal of                         tumor(s), polyp(s), or other lesion(s) by snare                         technique Diagnosis Code(s):     --- Professional ---                        Z12.11, Encounter for screening for malignant neoplasm                         of colon                        K63.5, Polyp of colon CPT copyright 2019 American Medical Association. All rights reserved. The codes documented in this report are preliminary and upon coder review may  be revised to meet current compliance requirements. Lucilla Lame MD,  MD 09/16/2019 9:56:04 AM This report has been signed electronically. Number of Addenda: 0 Note Initiated On: 09/16/2019 9:38 AM Scope Withdrawal Time: 0 hours 7 minutes 14 seconds  Total Procedure Duration: 0 hours 8 minutes 58 seconds  Estimated Blood Loss:  Estimated blood loss: none.      [facility]

## 2019-09-16 NOTE — Anesthesia Preprocedure Evaluation (Signed)
Anesthesia Evaluation  Patient identified by MRN, date of birth, ID band Patient awake    Reviewed: Allergy & Precautions, H&P , NPO status , Patient's Chart, lab work & pertinent test results, reviewed documented beta blocker date and time   Airway Mallampati: II   Neck ROM: full    Dental  (+) Teeth Intact   Pulmonary neg pulmonary ROS,    Pulmonary exam normal        Cardiovascular negative cardio ROS Normal cardiovascular exam Rhythm:regular Rate:Normal     Neuro/Psych Seizures -, Well Controlled,  PSYCHIATRIC DISORDERS Depression    GI/Hepatic negative GI ROS, Neg liver ROS,   Endo/Other  Morbid obesity  Renal/GU negative Renal ROS  negative genitourinary   Musculoskeletal   Abdominal   Peds  Hematology  (+) Blood dyscrasia, anemia ,   Anesthesia Other Findings Past Medical History: No date: Anemia No date: Depression No date: Eczema 1997: History of traumatic brain injury No date: Seizures ([place])     Comment:  hx- after TBI, last sz 2001 No date: Uterine fibroid     Comment:  history Past Surgical History: 1988: DILATION AND CURETTAGE OF UTERUS 2009: UTERINE ARTERY EMBOLIZATION BMI    Body Mass Index: 47.14 kg/m     Reproductive/Obstetrics negative OB ROS                             Anesthesia Physical Anesthesia Plan  ASA: III  Anesthesia Plan: General   Post-op Pain Management:    Induction:   PONV Risk Score and Plan:   Airway Management Planned:   Additional Equipment:   Intra-op Plan:   Post-operative Plan:   Informed Consent: I have reviewed the patients History and Physical, chart, labs and discussed the procedure including the risks, benefits and alternatives for the proposed anesthesia with the patient or authorized representative who has indicated his/her understanding and acceptance.     Dental Advisory Given  Plan Discussed with:  CRNA  Anesthesia Plan Comments:         Anesthesia Quick Evaluation

## 2019-09-17 ENCOUNTER — Encounter: Payer: Self-pay | Admitting: Gastroenterology

## 2019-09-17 LAB — SURGICAL PATHOLOGY

## 2019-09-17 NOTE — Anesthesia Postprocedure Evaluation (Signed)
Anesthesia Post Note  Patient: Valerie Underwood  Procedure(s) Performed: COLONOSCOPY WITH PROPOFOL (N/A )  Patient location during evaluation: PACU Anesthesia Type: General Level of consciousness: awake and alert Pain management: pain level controlled Vital Signs Assessment: post-procedure vital signs reviewed and stable Respiratory status: spontaneous breathing, nonlabored ventilation, respiratory function stable and patient connected to nasal cannula oxygen Cardiovascular status: blood pressure returned to baseline and stable Postop Assessment: no apparent nausea or vomiting Anesthetic complications: no   No complications documented.   Last Vitals:  Vitals:   09/16/19 1020 09/16/19 1023  BP: 110/68   Pulse: (!) 59 60  Resp: 10 15  Temp:    SpO2: 97%     Last Pain:  Vitals:   09/17/19 0802  TempSrc:   PainSc: 0-No pain                 Molli Barrows

## 2019-09-19 ENCOUNTER — Encounter: Payer: Self-pay | Admitting: Gastroenterology

## 2019-10-14 ENCOUNTER — Ambulatory Visit: Payer: BC Managed Care – PPO | Attending: Internal Medicine

## 2019-10-14 DIAGNOSIS — Z23 Encounter for immunization: Secondary | ICD-10-CM

## 2019-10-14 NOTE — Progress Notes (Signed)
° °  Covid-19 Vaccination Clinic  Name:  Valerie Underwood    MRN: [id number] DOB: 1970/08/18  10/14/2019  [name] was observed post Covid-19 immunization for 15 minutes without incident. She was provided with Vaccine Information Sheet and instruction to access the V-Safe system.   [name] was instructed to call 911 with any severe reactions post vaccine:  Difficulty breathing   Swelling of face and throat   A fast heartbeat   A bad rash all over body   Dizziness and weakness

## 2019-10-21 MED FILL — PFIZER-BIONTECH COVID-19 VA: 30 | 1 days supply | Qty: 0 | Fill #0

## 2019-10-23 ENCOUNTER — Other Ambulatory Visit: Payer: Self-pay

## 2019-10-23 ENCOUNTER — Encounter: Payer: Self-pay | Admitting: Emergency Medicine

## 2019-10-23 ENCOUNTER — Ambulatory Visit
Admission: EM | Admit: 2019-10-23 | Discharge: 2019-10-23 | Disposition: A | Discharge status: Home / Self Care | Payer: BC Managed Care – PPO | Attending: Internal Medicine | Admitting: Internal Medicine

## 2019-10-23 DIAGNOSIS — K047 Periapical abscess without sinus: Secondary | ICD-10-CM

## 2019-10-23 MED ORDER — PENICILLIN V POTASSIUM 500 MG PO TABS
500.0000 mg | ORAL_TABLET | Freq: Four times a day (QID) | ORAL | 0 refills | Status: AC
Start: 2019-10-23 — End: 2019-11-02

## 2019-10-23 MED ORDER — PENICILLIN V POTASSIUM 500 MG PO TABS
500.0000 mg | ORAL_TABLET | Freq: Four times a day (QID) | ORAL | 0 refills | Status: DC
Start: 2019-10-23 — End: 2019-10-23

## 2019-10-23 NOTE — ED Triage Notes (Signed)
Patient c/o right lower jaw pain that started last night. She states she is having swelling and pain on the right side of her neck and jaw.

## 2019-10-23 NOTE — Discharge Instructions (Signed)
Follow-up with your dentist next week

## 2019-10-23 NOTE — ED Provider Notes (Signed)
[facility]    CSN: [id number] Arrival date & time: 10/23/19  1827      History   Chief Complaint Chief Complaint  Patient presents with  . Facial Swelling    HPI Valerie Underwood is a 49 y.o. female who presents with R face swelling. She was awaken around 1 am with R jaw pain and had not wore her mouth guard since she grinds her teeth, so she put it on and went back to sleep. Today she took Tylenol and Ibuprofen and felt she was fine, but woke up with R neck gland and face swelling. Denies fever. Just had her teeth cleaned up which she gets q 6 months, but was not time for xray. The last R bottom molar is loose from her grinding her teeth, but has not given her problems.    Past Medical History:  Diagnosis Date  . Anemia   . Depression   . Eczema   . History of traumatic brain injury 1997  . Seizures ([place])    hx- after TBI, last sz 2001  . Uterine fibroid    history    Patient Active Problem List   Diagnosis Date Noted  . Encounter for screening colonoscopy   . Polyp of transverse colon   . Depression, recurrent ([place]) 05/02/2017  . Family history of PMS (premenstrual syndrome) 05/17/2016  . BMI 40.0-44.9, adult ([place]) 05/17/2016  . Metatarsalgia 05/24/2011  . Gait abnormality 05/24/2011  . Hypertriglyceridemia 01/18/2011  . Morbid obesity ([place]) 01/17/2011  . Depression 01/17/2011  . Anemia   . Uterine fibroid   . History of traumatic brain injury   . Seizures ([place])   . Left foot pain 11/11/2010    Past Surgical History:  Procedure Laterality Date  . COLONOSCOPY WITH PROPOFOL N/A 09/16/2019   Procedure: COLONOSCOPY WITH PROPOFOL;  Surgeon: Lucilla Lame, MD;  Location: [facility] ENDOSCOPY;  Service: Endoscopy;  Laterality: N/A;  . DILATION AND CURETTAGE OF UTERUS  1988  . UTERINE ARTERY EMBOLIZATION  2009    OB History    Gravida  1   Para      Term      Preterm      AB  1   Living        SAB  1   TAB      Ectopic      Multiple      Live  Births               Home Medications    Prior to Admission medications   Medication Sig Start Date End Date Taking? Authorizing Provider  clobetasol cream (TEMOVATE) 4.01 % Apply 1 application topically 2 (two) times daily. 05/02/17  Yes Otilio Miu C, MD  EPINEPHrine 0.3 mg/0.3 mL IJ SOAJ injection SMARTSIG:1 Pre-Filled Pen Syringe IM 07/26/19  Yes [provider]  fluticasone (FLONASE) 50 MCG/ACT nasal spray Place 2 sprays into both nostrils daily. 06/25/19  Yes [provider]  ketorolac (TORADOL) 10 MG tablet ketorolac 10 mg tablet   Yes [provider]  meclizine (ANTIVERT) 25 MG tablet meclizine 25 mg tablet   Yes [provider]  Multiple Vitamin (MULTIVITAMIN) tablet Take 1 tablet by mouth daily.     Yes [provider]  mupirocin ointment (BACTROBAN) 2 % Apply 1 application topically 3 (three) times daily. 02/24/18  Yes Lorin Picket, PA-C  pantoprazole (PROTONIX) 40 MG tablet Take 40 mg by mouth daily. 06/27/19  Yes [provider]  Phendimetrazine Tartrate 35 MG TABS Take 1 tablet by mouth 3 (three) times daily. 07/08/19  Yes [provider]  sertraline (ZOLOFT) 100 MG tablet Take 1 tablet (100 mg total) by mouth daily. PRN for menstrual cycle 05/02/17  Yes Juline Patch, MD  Sodium Sulfate-Mag Sulfate-KCl (SUTAB) [phone] MG TABS Take 12 tablets by mouth in the morning and at bedtime. At 5pm take 12 tablets and then 5 hours prior to colonoscopy take the other 12. 08/13/19  Yes Lucilla Lame, MD  penicillin v potassium (VEETID) 500 MG tablet Take 1 tablet (500 mg total) by mouth 4 (four) times daily for 10 days. 10/23/19 11/02/19  Rodriguez-Southworth, Sunday Spillers, PA-C    Family History Family History  Adopted: Yes  Problem Relation Age of Onset  . Stroke Mother   . Hypertension Father   . Diabetes Father   . Cancer Maternal Grandmother   . Breast cancer Maternal Grandmother     Social History Social History     Tobacco Use  . Smoking status: Never Smoker  . Smokeless tobacco: Never Used  Vaping Use  . Vaping Use: Never used  Substance Use Topics  . Alcohol use: Yes    Alcohol/week: 4.0 standard drinks    Types: 4 Glasses of wine per week  . Drug use: No   Allergies   Statins and Benadryl [diphenhydramine]   Review of Systems Review of Systems  Constitutional: Negative for fever.  HENT: Positive for dental problem and facial swelling.      Physical Exam Triage Vital Signs ED Triage Vitals  Enc Vitals Group     BP 10/23/19 1946 (!) 171/80     Pulse Rate 10/23/19 1946 75     Resp 10/23/19 1946 18     Temp 10/23/19 1946 98.7 F (37.1 C)     Temp Source 10/23/19 1946 Oral     SpO2 10/23/19 1946 100 %     Weight 10/23/19 1944 (!) 310 lb (140.6 kg)     Height 10/23/19 1944 5\' 8"  (1.727 m)     Head Circumference --      Peak Flow --      Pain Score 10/23/19 1943 6     Pain Loc --      Pain Edu? --      Excl. in [place]? --    No data found.  Updated Vital Signs BP (!) 171/80 (BP Location: Right Arm)   Pulse 75   Temp 98.7 F (37.1 C) (Oral)   Resp 18   Ht 5\' 8"  (1.727 m)   Wt (!) 310 lb (140.6 kg)   SpO2 100%   BMI 47.14 kg/m   Visual Acuity Right Eye Distance:   Left Eye Distance:   Bilateral Distance:    Right Eye Near:   Left Eye Near:    Bilateral Near:     Physical Exam Constitutional:      General: She is not in acute distress.    Appearance: She is obese. She is not toxic-appearing.  HENT:     Head: Atraumatic.     Right Ear: External ear normal.     Left Ear: External ear normal.     Mouth/Throat:     Mouth: Mucous membranes are moist.     Pharynx: Oropharynx is clear.     Comments: Her gums look healthy. Tapping her R lower molar is tender.  Eyes:     General: No scleral icterus.    Conjunctiva/sclera: Conjunctivae  normal.  Neck:     Comments: R anterior cervical chain Pulmonary:     Effort: Pulmonary effort is normal.  Musculoskeletal:         General: Normal range of motion.     Cervical back: Neck supple.  Lymphadenopathy:     Cervical: Cervical adenopathy present.  Skin:    General: Skin is warm and dry.     Findings: No rash.  Neurological:     Mental Status: She is alert and oriented to person, place, and time.     Gait: Gait normal.  Psychiatric:        Mood and Affect: Mood normal.        Behavior: Behavior normal.        Thought Content: Thought content normal.        Judgment: Judgment normal.    UC Treatments / Results  Labs (all labs ordered are listed, but only abnormal results are displayed) Labs Reviewed - No data to display  EKG   Radiology No results found.  Procedures Procedures (including critical care time)  Medications Ordered in UC Medications - No data to display  Initial Impression / Assessment and Plan / UC Course  I have reviewed the triage vital signs and the nursing notes. I am suspicious she has a dental abscess so I placed her on Penicillin as noted. She declined anything stronger for pain.  Final Clinical Impressions(s) / UC Diagnoses   Final diagnoses:  Dental abscess     Discharge Instructions     Follow up with your dentist next week    ED Prescriptions    Medication Sig Dispense Auth. Provider   penicillin v potassium (VEETID) 500 MG tablet  (Status: Discontinued) Take 1 tablet (500 mg total) by mouth 4 (four) times daily for 10 days. 40 tablet Rodriguez-Southworth, Shykeem Resurreccion, PA-C   penicillin v potassium (VEETID) 500 MG tablet Take 1 tablet (500 mg total) by mouth 4 (four) times daily for 10 days. 40 tablet Rodriguez-Southworth, Sunday Spillers, PA-C     PDMP not reviewed this encounter.   Shelby Mattocks, [place] 10/23/19 2027

## 2019-10-27 IMAGING — MG DIGITAL SCREENING BILATERAL MAMMOGRAM WITH TOMO AND CAD
6 of 12 series · 6 of 36 positions shown · non-contrast
Comparison: Previous exam(s).

CLINICAL DATA: Screening.

EXAM:
DIGITAL SCREENING BILATERAL MAMMOGRAM WITH TOMO AND CAD

[L CC synth-2D]
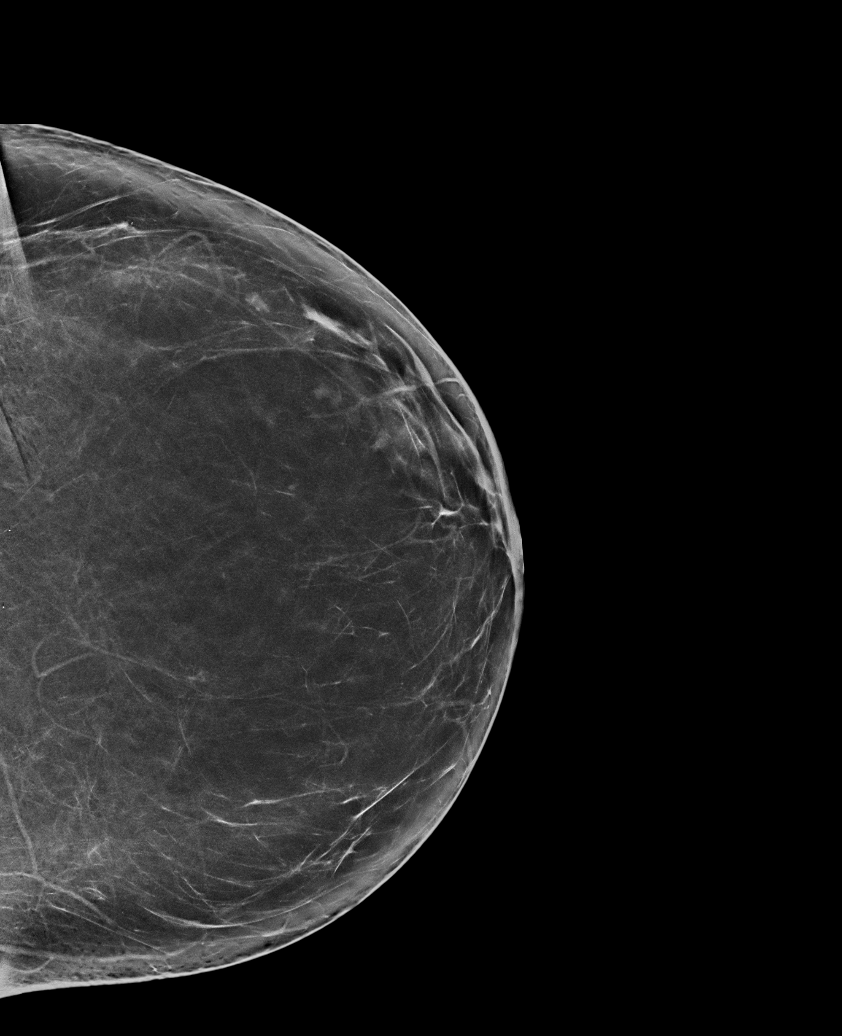

[L MLO synth-2D]
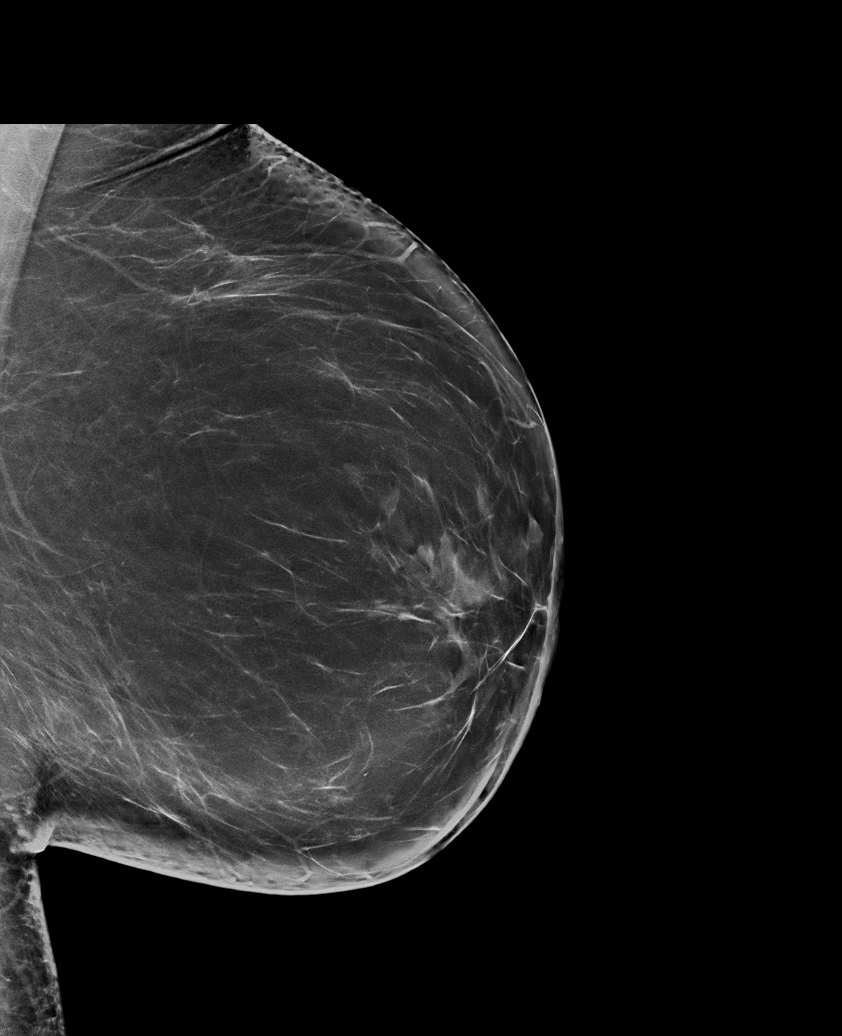

[R CC synth-2D (1 of 2)]
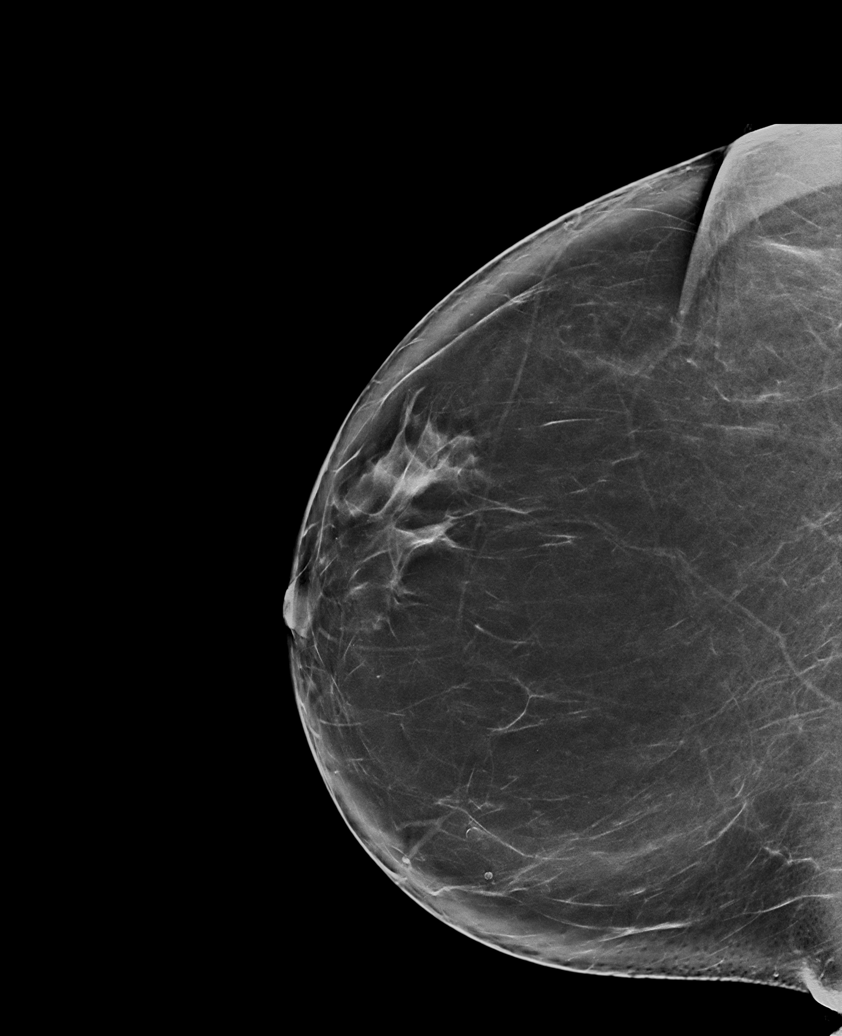

[L XCCL synth-2D]
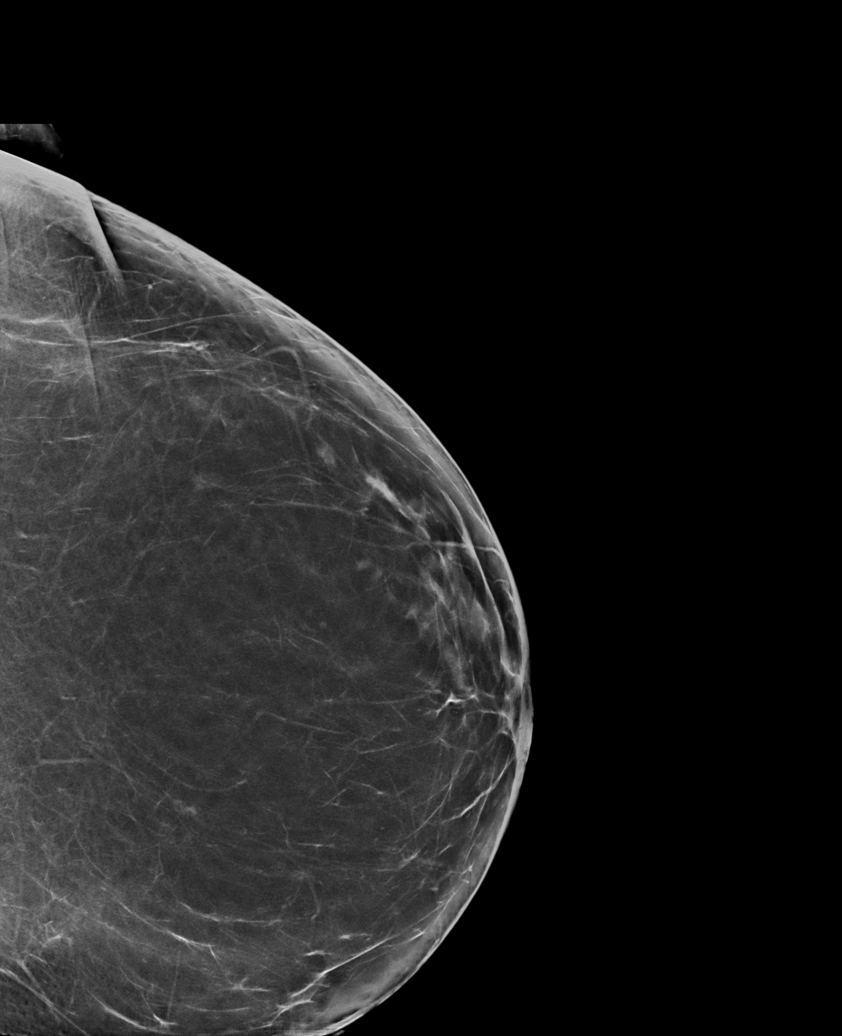

[R CC synth-2D (2 of 2)]
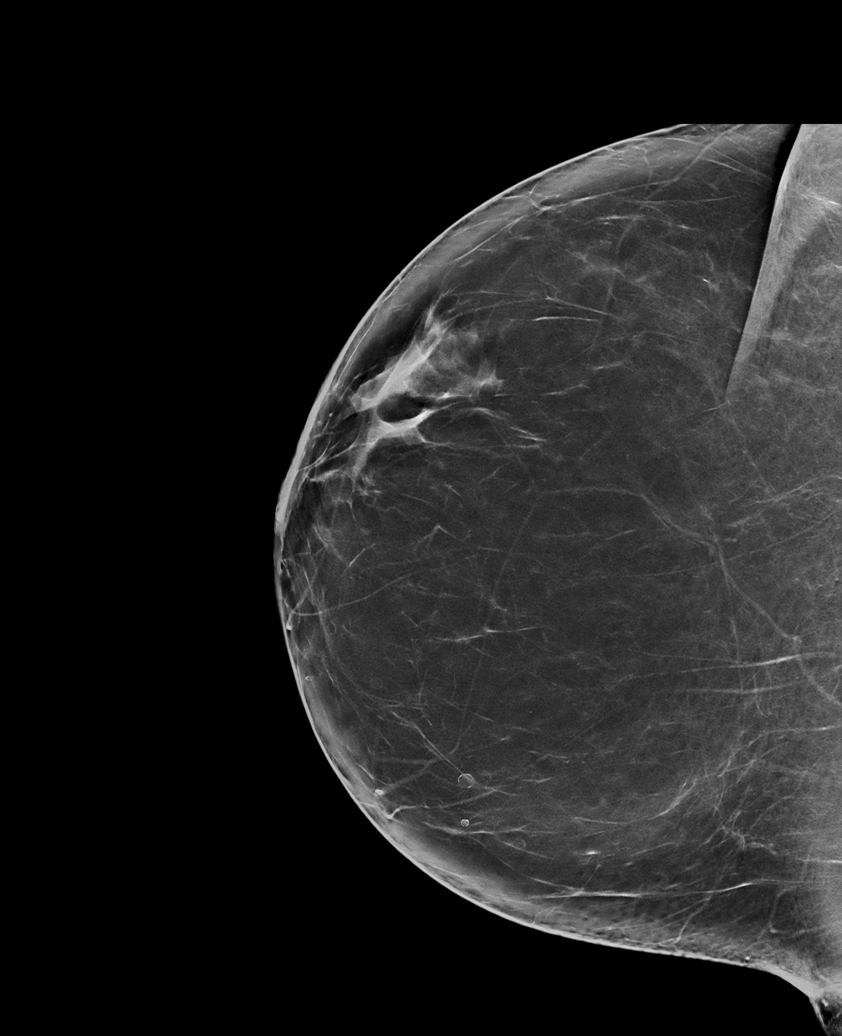

[R MLO synth-2D]
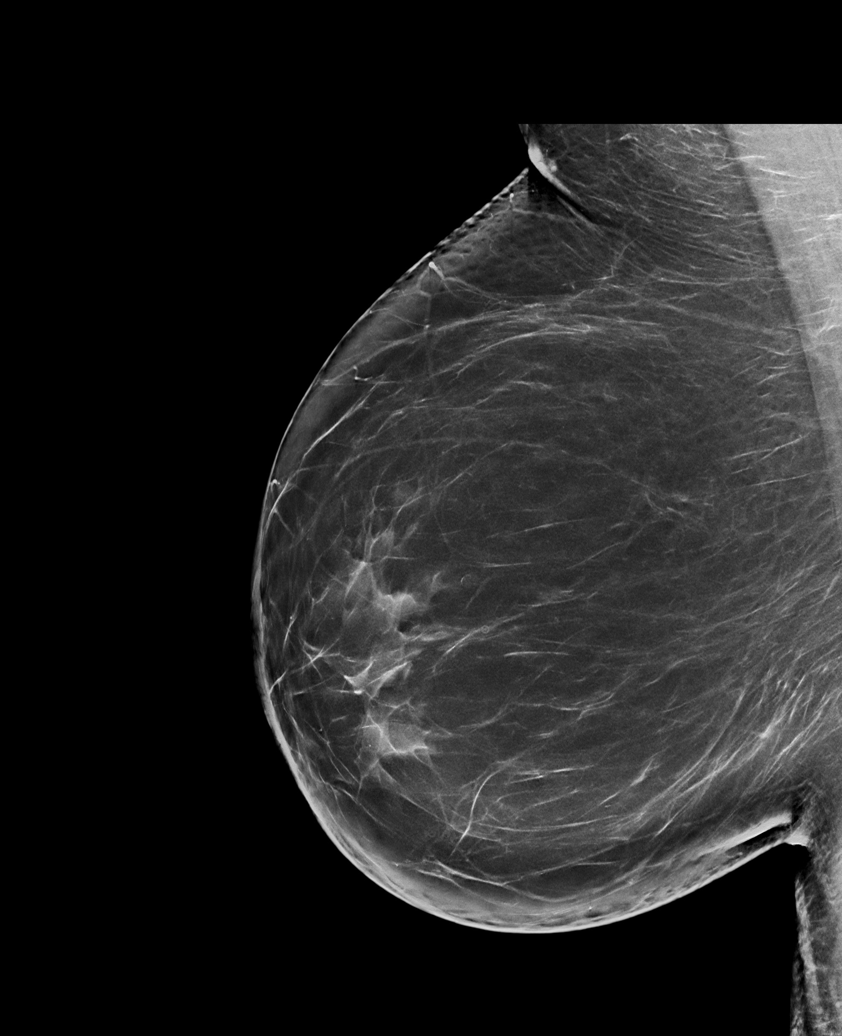

[6 of 36 positions shown; findings below may reference images not displayed]

ACR Breast Density Category b: There are scattered areas of
fibroglandular density.
FINDINGS: There are no findings suspicious for malignancy. Images were
processed with CAD.
IMPRESSION: No mammographic evidence of malignancy. A result letter of this
screening mammogram will be mailed directly to the patient.

RECOMMENDATION:
Screening mammogram in one year. (Code:CN-U-775)

BI-RADS CATEGORY  1: Negative.

## 2019-12-10 ENCOUNTER — Other Ambulatory Visit (HOSPITAL_BASED_OUTPATIENT_CLINIC_OR_DEPARTMENT_OTHER): Payer: Self-pay | Admitting: Internal Medicine

## 2019-12-10 MED FILL — FLUARIX QUADRIVALENT 0.5 ML: 0.5 | 1 days supply | Qty: 1 | Fill #0

## 2020-06-21 ENCOUNTER — Other Ambulatory Visit: Payer: Self-pay | Admitting: Physician Assistant

## 2020-06-21 DIAGNOSIS — Z1231 Encounter for screening mammogram for malignant neoplasm of breast: Secondary | ICD-10-CM

## 2020-06-24 ENCOUNTER — Other Ambulatory Visit: Payer: Self-pay

## 2020-06-24 ENCOUNTER — Ambulatory Visit
Admission: RE | Admit: 2020-06-24 | Discharge: 2020-06-24 | Disposition: A | Discharge status: Home / Self Care | Payer: BC Managed Care – PPO | Source: Ambulatory Visit | Attending: Physician Assistant | Admitting: Physician Assistant

## 2020-06-24 DIAGNOSIS — Z1231 Encounter for screening mammogram for malignant neoplasm of breast: Secondary | ICD-10-CM | POA: Insufficient documentation

## 2020-12-02 ENCOUNTER — Other Ambulatory Visit: Payer: Self-pay

## 2020-12-02 ENCOUNTER — Ambulatory Visit: Payer: BC Managed Care – PPO | Attending: Internal Medicine

## 2020-12-02 DIAGNOSIS — Z23 Encounter for immunization: Secondary | ICD-10-CM

## 2020-12-02 MED ORDER — PFIZER COVID-19 VAC BIVALENT 30 MCG/0.3ML IM SUSP
INTRAMUSCULAR | 0 refills | Status: DC
  Filled 2020-12-02: qty 0.3, 1d supply, fill #0

## 2020-12-02 MED ORDER — FLUARIX QUADRIVALENT 0.5 ML IM SUSY
PREFILLED_SYRINGE | INTRAMUSCULAR | 0 refills | Status: DC
  Filled 2020-12-02: qty 0.5, 1d supply, fill #0

## 2020-12-02 NOTE — Progress Notes (Signed)
   Covid-19 Vaccination Clinic  Name:  Valerie Underwood    MRN: [id number] DOB: 1970-09-14  12/02/2020  [name] was observed post Covid-19 immunization for 15 minutes without incident. She was provided with Vaccine Information Sheet and instruction to access the V-Safe system.   [name] was instructed to call 911 with any severe reactions post vaccine: Difficulty breathing  Swelling of face and throat  A fast heartbeat  A bad rash all over body  Dizziness and weakness   Immunizations Administered     Name Date Dose VIS Date Route   Pfizer Covid-19 Vaccine Bivalent Booster 12/02/2020 12:12 PM 0.3 mL 10/13/2020 Intramuscular   Manufacturer: [place]   Lot: [place]   [place]: [phone]

## 2020-12-07 ENCOUNTER — Other Ambulatory Visit: Payer: Self-pay

## 2021-03-28 ENCOUNTER — Other Ambulatory Visit: Payer: Self-pay | Admitting: Physician Assistant

## 2021-03-28 DIAGNOSIS — Z1231 Encounter for screening mammogram for malignant neoplasm of breast: Secondary | ICD-10-CM

## 2021-07-04 ENCOUNTER — Inpatient Hospital Stay: Admission: RE | Admit: 2021-07-04 | Payer: BC Managed Care – PPO | Source: Ambulatory Visit

## 2021-07-04 ENCOUNTER — Ambulatory Visit
Admission: RE | Admit: 2021-07-04 | Discharge: 2021-07-04 | Disposition: A | Discharge status: Home / Self Care | Payer: BC Managed Care – PPO | Source: Ambulatory Visit | Attending: Physician Assistant | Admitting: Physician Assistant

## 2021-07-04 DIAGNOSIS — Z1231 Encounter for screening mammogram for malignant neoplasm of breast: Secondary | ICD-10-CM | POA: Diagnosis present

## 2022-01-29 ENCOUNTER — Telehealth: Payer: BC Managed Care – PPO | Admitting: Family

## 2022-01-29 DIAGNOSIS — L03012 Cellulitis of left finger: Secondary | ICD-10-CM

## 2022-01-29 MED ORDER — CEPHALEXIN 500 MG PO CAPS: 500.0000 mg | ORAL_CAPSULE | Freq: Three times a day (TID) | ORAL | 0 refills | Status: AC

## 2022-01-29 NOTE — Progress Notes (Signed)
Virtual Visit Consent   Valerie Underwood, you are scheduled for a virtual visit with a [facility] provider today. Just as with appointments in the office, your consent must be obtained to participate. Your consent will be active for this visit and any virtual visit you may have with one of our providers in the next 365 days. If you have a MyChart account, a copy of this consent can be sent to you electronically.  As this is a virtual visit, video technology does not allow for your provider to perform a traditional examination. This may limit your provider's ability to fully assess your condition. If your provider identifies any concerns that need to be evaluated in person or the need to arrange testing (such as labs, EKG, etc.), we will make arrangements to do so. Although advances in technology are sophisticated, we cannot ensure that it will always work on either your end or our end. If the connection with a video visit is poor, the visit may have to be switched to a telephone visit. With either a video or telephone visit, we are not always able to ensure that we have a secure connection.  By engaging in this virtual visit, you consent to the provision of healthcare and authorize for your insurance to be billed (if applicable) for the services provided during this visit. Depending on your insurance coverage, you may receive a charge related to this service.  I need to obtain your verbal consent now. Are you willing to proceed with your visit today? Valerie Underwood has provided verbal consent on 01/29/2022 for a virtual visit (video or telephone). Valerie Dun, FNP  Date: 01/29/2022 1:28 PM  Virtual Visit via Video Note   I, Valerie Underwood, connected with  Valerie Underwood  ([id number], [birth year]) on 01/29/22 at  1:15 PM EST by a video-enabled telemedicine application and verified that I am speaking with the correct person using two identifiers.  Location: Patient: Virtual Visit Location Patient: Other:  car Provider: Virtual Visit Location Provider: Home Office   I discussed the limitations of evaluation and management by telemedicine and the availability of in person appointments. The patient expressed understanding and agreed to proceed.    History of Present Illness: Valerie Underwood is a 51 y.o. who identifies as a female who was assigned female at birth, and is being seen today for swelling and tenderness of left ring finger that started yesterday. However, this morning it was increased swelling, tenderness, and redness. Reports her pain is a 7 out 10 if she touches and 2 if just sitting.   HPI: HPI  Problems:  Patient Active Problem List   Diagnosis Date Noted   Encounter for screening colonoscopy    Polyp of transverse colon    Depression, recurrent ([place]) 05/02/2017   Family history of PMS (premenstrual syndrome) 05/17/2016   BMI 40.0-44.9, adult ([place]) 05/17/2016   Metatarsalgia 05/24/2011   Gait abnormality 05/24/2011   Hypertriglyceridemia 01/18/2011   Morbid obesity ([place]) 01/17/2011   Depression 01/17/2011   Anemia    Uterine fibroid    History of traumatic brain injury    Seizures ([place])    Left foot pain 11/11/2010    Allergies:  Allergies  Allergen Reactions   Statins Other (See Comments)    Body aches   Benadryl [Diphenhydramine] Palpitations    IV only-can take oral   Medications:  Current Outpatient Medications:    cephALEXin (KEFLEX) 500 MG capsule, Take 1 capsule (500 mg total) by  mouth 3 (three) times daily for 7 days., Disp: 21 capsule, Rfl: 0   clobetasol cream (TEMOVATE) 3.26 %, Apply 1 application topically 2 (two) times daily., Disp: 30 g, Rfl: 11   COVID-19 mRNA bivalent vaccine, Pfizer, (PFIZER COVID-19 VAC BIVALENT) injection, Inject into the muscle., Disp: 0.3 mL, Rfl: 0   EPINEPHrine 0.3 mg/0.3 mL IJ SOAJ injection, SMARTSIG:1 Pre-Filled Pen Syringe IM, Disp: , Rfl:    fluticasone (FLONASE) 50 MCG/ACT nasal spray, Place 2 sprays into both nostrils  daily., Disp: , Rfl:    influenza vac split quadrivalent PF (FLUARIX QUADRIVALENT) 0.5 ML injection, Inject into the muscle., Disp: 0.5 mL, Rfl: 0   ketorolac (TORADOL) 10 MG tablet, ketorolac 10 mg tablet, Disp: , Rfl:    meclizine (ANTIVERT) 25 MG tablet, meclizine 25 mg tablet, Disp: , Rfl:    Multiple Vitamin (MULTIVITAMIN) tablet, Take 1 tablet by mouth daily.  , Disp: , Rfl:    mupirocin ointment (BACTROBAN) 2 %, Apply 1 application topically 3 (three) times daily., Disp: 22 g, Rfl: 0   pantoprazole (PROTONIX) 40 MG tablet, Take 40 mg by mouth daily., Disp: , Rfl:    Phendimetrazine Tartrate 35 MG TABS, Take 1 tablet by mouth 3 (three) times daily., Disp: , Rfl:    sertraline (ZOLOFT) 100 MG tablet, Take 1 tablet (100 mg total) by mouth daily. PRN for menstrual cycle, Disp: 90 tablet, Rfl: 1   Sodium Sulfate-Mag Sulfate-KCl (SUTAB) [phone] MG TABS, Take 12 tablets by mouth in the morning and at bedtime. At 5pm take 12 tablets and then 5 hours prior to colonoscopy take the other 12., Disp: 24 tablet, Rfl: 0  Observations/Objective: Patient is well-developed, well-nourished in no acute distress.  Resting comfortably  at home.  Head is normocephalic, atraumatic.  No labored breathing.  Speech is clear and coherent with logical content.  Patient is alert and oriented at baseline.  Left distal ring finger erythemas, swollen, and  tender  Assessment and Plan: 1. Paronychia of finger of left hand - cephALEXin (KEFLEX) 500 MG capsule; Take 1 capsule (500 mg total) by mouth 3 (three) times daily for 7 days.  Dispense: 21 capsule; Refill: 0  Soak water Start Keflex TID prn  Keep clean and dry  Follow up if symptoms worsen or do not improve   Follow Up Instructions: I discussed the assessment and treatment plan with the patient. The patient was provided an opportunity to ask questions and all were answered. The patient agreed with the plan and demonstrated an understanding of the  instructions.  A copy of instructions were sent to the patient via MyChart unless otherwise noted below.     The patient was advised to call back or seek an in-person evaluation if the symptoms worsen or if the condition fails to improve as anticipated.  Time:  I spent 11 minutes with the patient via telehealth technology discussing the above problems/concerns.    Valerie Dun, FNP

## 2022-01-29 NOTE — Patient Instructions (Addendum)
FParonychia Paronychia is an infection of the skin that surrounds a nail. It usually affects the skin around a fingernail, but it may also occur near a toenail. It often causes pain and swelling around the nail. In some cases, a collection of pus (abscess) can form near or under the nail.  This condition may develop suddenly, or it may develop gradually over a longer period. In most cases, paronychia is not serious, and it will clear up with treatment. What are the causes? This condition may be caused by bacteria or a fungus, such as yeast. The bacteria or fungus can enter the body through an opening in the skin, such as a cut or a hangnail, and cause an infection in your fingernail or toenail. Other causes may include: Recurrent injury to the fingernail or toenail area. Irritation of the base and sides of the nail (cuticle). Injury and irritation can result in inflammation, swelling, and thickened skin around the nail. What increases the risk? This condition is more likely to develop in people who: Get their hands wet often, such as those who work as Designer, industrial/product, bartenders, or housekeepers. Bite their fingernails or cuticles. Have underlying skin conditions. Have hangnails or injured fingertips. Are exposed to irritants like detergents and other chemicals. Have diabetes. What are the signs or symptoms? Symptoms of this condition include: Redness and swelling of the skin near the nail. Tenderness around the nail when you touch the area. Pus-filled bumps under the cuticle. Fluid or pus under the nail. Throbbing pain in the area. How is this diagnosed? This condition is diagnosed with a physical exam. In some cases, a sample of pus may be tested to determine what type of bacteria or fungus is causing the condition. How is this treated? Treatment depends on the cause and severity of your condition. If your condition is mild, it may clear up on its own in a few days or after soaking in warm  water. If needed, treatment may include: Antibiotic medicine, if your infection is caused by bacteria. Antifungal medicine, if your infection is caused by a fungus. A procedure to drain pus from an abscess. Anti-inflammatory medicine (corticosteroids). Removal of part of an ingrown toenail. A bandage (dressing) may be placed over the affected area if an abscess or part of a nail has been removed. Follow these instructions at home: Wound care Keep the affected area clean. Soak the affected area in warm water if told to do so by your health care provider. You may be told to do this for 20 minutes, 2-3 times a day. Keep the area dry when you are not soaking it. Do not try to drain an abscess yourself. Follow instructions from your health care provider about how to take care of the affected area. Make sure you: Wash your hands with soap and water for at least 20 seconds before and after you change your dressing. If soap and water are not available, use hand sanitizer. Change your dressing as told by your health care provider. If you had an abscess drained, check the area every day for signs of infection. Check for: Redness, swelling, or pain. Fluid or blood. Warmth. Pus or a bad smell. Medicines  Take over-the-counter and prescription medicines only as told by your health care provider. If you were prescribed an antibiotic medicine, take it as told by your health care provider. Do not stop taking the antibiotic even if you start to feel better. General instructions Avoid contact with any skin irritants or allergens.  Do not pick at the affected area. Keep all follow-up visits as told. This is important. Prevention To prevent this condition from happening again: Wear rubber gloves when washing dishes or doing other tasks that require your hands to get wet. Wear gloves if your hands might come in contact with cleaners or other chemicals. Avoid injuring your nails or fingertips. Do not bite  your nails or tear hangnails. Do not cut your nails very short. Do not cut your cuticles. Use clean nail clippers or scissors when trimming nails. Contact a health care provider if: Your symptoms get worse or do not improve with treatment. You have continued or increased fluid, blood, or pus coming from the affected area. Your affected finger, toe, or joint becomes swollen or difficult to move. You have a fever or chills. There is redness spreading away from the affected area. Summary Paronychia is an infection of the skin that surrounds a nail. It often causes pain and swelling around the nail. In some cases, a collection of pus (abscess) can form near or under the nail. This condition may be caused by bacteria or a fungus. These germs can enter the body through an opening in the skin, such as a cut or a hangnail. If your condition is mild, it may clear up on its own in a few days. If needed, treatment may include medicine or a procedure to drain pus from an abscess. To prevent this condition from happening again, wear gloves if doing tasks that require your hands to get wet or to come in contact with chemicals. Also avoid injuring your nails or fingertips. This information is not intended to replace advice given to you by your health care provider. Make sure you discuss any questions you have with your health care provider. Document Revised: 05/03/2020 Document Reviewed: 05/03/2020 Elsevier Patient Education  2023 [employer]  

## 2022-05-22 ENCOUNTER — Other Ambulatory Visit: Payer: Self-pay | Admitting: Physician Assistant

## 2022-05-22 DIAGNOSIS — Z1231 Encounter for screening mammogram for malignant neoplasm of breast: Secondary | ICD-10-CM

## 2022-07-06 ENCOUNTER — Ambulatory Visit
Admission: RE | Admit: 2022-07-06 | Discharge: 2022-07-06 | Disposition: A | Discharge status: Home / Self Care | Payer: BC Managed Care – PPO | Source: Ambulatory Visit | Attending: Physician Assistant | Admitting: Physician Assistant

## 2022-07-06 DIAGNOSIS — Z1231 Encounter for screening mammogram for malignant neoplasm of breast: Secondary | ICD-10-CM | POA: Insufficient documentation

## 2022-11-16 ENCOUNTER — Ambulatory Visit: Payer: BC Managed Care – PPO | Attending: Rheumatology | Admitting: Occupational Therapy

## 2022-11-16 ENCOUNTER — Encounter: Payer: Self-pay | Admitting: Occupational Therapy

## 2022-11-16 DIAGNOSIS — M79641 Pain in right hand: Secondary | ICD-10-CM | POA: Insufficient documentation

## 2022-11-16 DIAGNOSIS — M25641 Stiffness of right hand, not elsewhere classified: Secondary | ICD-10-CM | POA: Diagnosis present

## 2022-11-16 DIAGNOSIS — M6281 Muscle weakness (generalized): Secondary | ICD-10-CM | POA: Diagnosis present

## 2022-11-16 DIAGNOSIS — M65311 Trigger thumb, right thumb: Secondary | ICD-10-CM | POA: Diagnosis present

## 2022-11-16 DIAGNOSIS — M79642 Pain in left hand: Secondary | ICD-10-CM | POA: Diagnosis present

## 2022-11-16 DIAGNOSIS — M25642 Stiffness of left hand, not elsewhere classified: Secondary | ICD-10-CM | POA: Insufficient documentation

## 2022-11-16 NOTE — Therapy (Signed)
OUTPATIENT OCCUPATIONAL THERAPY ORTHO EVALUATION  Patient Name: Valerie Underwood MRN: [id number] DOB:08/28/1970, 52 y.o., female Today's Date: 11/16/2022  PCP: Merlinda Frederick NP REFERRING PROVIDER: Defoor PA  END OF SESSION:  OT End of Session - 11/16/22 1349     Visit Number 1    Number of Visits 10    Date for OT Re-Evaluation 01/11/23    OT Start Time 1200    OT Stop Time 1305    OT Time Calculation (min) 65 min    Activity Tolerance Patient tolerated treatment well    Behavior During Therapy WFL for tasks assessed/performed             Past Medical History:  Diagnosis Date   Anemia    Depression    Eczema    History of traumatic brain injury 1997   Seizures ([facility])    hx- after TBI, last sz 2001   Uterine fibroid    history   Past Surgical History:  Procedure Laterality Date   COLONOSCOPY WITH PROPOFOL N/A 09/16/2019   Procedure: COLONOSCOPY WITH PROPOFOL;  Surgeon: Midge Minium, MD;  Location: [facility] ENDOSCOPY;  Service: Endoscopy;  Laterality: N/A;   DILATION AND CURETTAGE OF UTERUS  1988   UTERINE ARTERY EMBOLIZATION  2009   Patient Active Problem List   Diagnosis Date Noted   Encounter for screening colonoscopy    Polyp of transverse colon    Depression, recurrent ([facility]) 05/02/2017   Family history of PMS (premenstrual syndrome) 05/17/2016   BMI 40.0-44.9, adult ([facility]) 05/17/2016   Metatarsalgia 05/24/2011   Gait abnormality 05/24/2011   Hypertriglyceridemia 01/18/2011   Morbid obesity ([facility]) 01/17/2011   Depression 01/17/2011   Anemia    Uterine fibroid    History of traumatic brain injury    Seizures ([facility])    Left foot pain 11/11/2010    ONSET DATE: 2/24  REFERRING DIAG: RA of multiple joints   THERAPY DIAG:  Pain in right hand  Pain in left hand  Stiffness of right hand, not elsewhere classified  Stiffness of left hand, not elsewhere classified  Muscle weakness (generalized)  Trigger thumb of right hand  Rationale for Evaluation and  Treatment: Rehabilitation  SUBJECTIVE:   SUBJECTIVE STATEMENT: My hands had been bothering me now for a long time- was refer to rheumatology - on 2 medications - R hand worse than L - and increase pain at times to 8/10 - with increase swelling and redness - pain in thumb - hard time gripping, lifting , holding objects  Pt accompanied by: self  PERTINENT HISTORY: Last appt with Defoor PA - 11/02/22 - Pt Plaquenil since 09/07/22 - but cont to have some flare ups- thumb pain , increase swelling and and morning stiffness - Pt now on light dose of Methotrexate - 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 Stiffness in am - pain in R hand can increase to 8/10 - and L hand 5/10   FALLS: Has patient fallen in last 6 months? Yes. 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  Has cats at home likes to read on her tablet, cleaning around the house on  her phone about 2 hours a day.  PATIENT GOALS: Want the pain  in my hands better and  the stiffness - keep my motion and strength in my hands to do the things I love and to be independent   NEXT MD VISIT: End of Oct  OBJECTIVE:  Note: Objective measures were completed at Evaluation unless otherwise noted.  HAND DOMINANCE: Right    UPPER EXTREMITY ROM:     Active ROM Right eval Left eval  Shoulder flexion    Shoulder abduction    Shoulder adduction    Shoulder extension    Shoulder internal rotation    Shoulder external rotation    Elbow flexion    Elbow extension    Wrist flexion 70 70  Wrist extension 80 80  Wrist ulnar deviation 30 30  Wrist radial deviation 20 1/10 pain  20  Wrist pronation    Wrist supination     (Blank rows = not tested)  Active ROM Right eval Left eval  Thumb MCP (0-60)    Thumb IP (0-80) -20 ext - locking - pain    Thumb Radial abd/add (0-55) 50 60  Thumb Palmar abd/add (0-45) 70 60  Thumb Opposition to Small Finger Opposition to base of 5th - pain     Index MCP (0-90) 80  85  Index PIP (0-100) 95 95   Index DIP (0-70)      Long MCP (0-90) 80   90  Long PIP (0-100) 100  100  Long DIP (0-70)      Ring MCP (0-90) 80  90   Ring PIP (0-100) 100  100   Ring DIP (0-70)      Little MCP (0-90) 80  90   Little PIP (0-100)  100 95   Little DIP (0-70)      (Blank rows = not tested)   UPPER EXTREMITY MMT:  Strength in wrist in all planes - 4+/5 - no pain     HAND FUNCTION: Grip strength: Right: 22 lbs; Left: 55 lbs, Lateral pinch: Right: 8 pain 5/10 lbs, Left: 11 lbs, and 3 point pinch: Right: 11 lbs, Left: 11 lbs   SENSATION: Pt report CT symptoms- numbness but splints helps at night time- but when gripping object , pinching can go numb   EDEMA:   R thumb and MC's at R hand   COGNITION: Overall cognitive status: Within functional limits for tasks assessed       TODAY'S TREATMENT:                                                                                                                              DATE: 11/16/22 Fluidotherapy done to left hand and contrast to the right-afterwards review gentle active range of motion for tendon glides. 12 reps 2 times a day after modalities at home pain-free Opposition to all digits with the left hand but hold off on the right because of thumb.  Ice massage several times a day and right thumb A1 pulley Gutter splint fabricated for right thumb IP to increase extension and decrease locking of thumb IP. To wear is much as she cannot. Remove during contrast or modalities and active range of motion. Reviewed with patient handouts and provided for joint protection and modifications.  PATIENT EDUCATION: Education details: findings  of eval and HEP -joint protection and modifications Person educated: Patient Education method: Explanation, Demonstration, Tactile cues, Verbal cues, and Handouts Education comprehension: verbalized understanding, returned demonstration, verbal cues required, and needs further education    GOALS: Goals reviewed with patient? Yes  SHORT TERM GOALS: Target date: 2 wks  Pain and tenderness decreased in right thumb to less than a 5/10 patient able to maintain thumb IP extension and resting without splint. Baseline: Patient unable to maintain IP extension of right thumb.  locking at -20 degrees-tenderness over thumb A1 pulley 5-8/10 as well as with passive extension of thumb IP Goal status: INITIAL    LONG TERM GOALS: Target date: 8 wk  Patient to be independent in home program to maintain digits active range of bilateral hands within normal limits and symptom-free including left thumb Baseline: Patient with increased stiffness and tightness over right metacarpals and decrease motion, left hand stiffness and tightness with pain can increase at the most to 8/10 in the right hand and 5/10 in the left hand Goal status: INITIAL  2.  Right thumb active range of motion increased to within functional limits and basic ADLs with triggering less than 1 time a day  baseline: Right thumb locks to -20 degrees extension at rest ;unable to maintain full extension.  Tenderness over the A1 pulley 5/10 Goal status: INITIAL  3.  Right grip strength increased to more than 60% compared to the left for patient to be able to grip utensils, brush and toothbrush without increase symptoms Baseline: Grip on the right dominant hand 22 pounds and left 55 pounds-patient report difficulty gripping objects Goal status: INITIAL  4.  Patient to verbalize 3 joint protection and modifications as well as adaptive equipment to decrease pain in bilateral hands as well as triggering. 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  Patient present at OT evaluation with increased pain in bilateral hands with the right worse than the left.  5-8/10 pain with functional use.  Patient has a history of bilateral carpal tunnel symptoms.  Patient reports she sleeps with her wrist braces and has numbness with sustained grip during the day with certain activities.  Patient with increased stiffness and tightness in bilateral hands over MCPs and PIPs with composite fisting.  Left thumb active range of motion within normal limits.  Right thumb patient with what appeared trigger thumb unable to maintain IP extension.  Thumb IP extension locks at -20 with increased tenderness and pain over the A1 pulley.  Active range of motion within functional limits with stiffness more than pain.  Patient with decreased grip and prehension strength on the right dominant hand.  Patient limited in performing ADLs and IADLs involving gripping, pinching, lifting, pulling and carrying.  PERFORMANCE DEFICITS: in functional skills including ADLs, IADLs, coordination, dexterity, sensation, ROM, strength, pain, flexibility, Fine motor control, decreased knowledge of  use of DME, and UE functional use, , and psychosocial skills including environmental adaptation and routines and behaviors.   IMPAIRMENTS: are limiting patient from ADLs, IADLs, rest and sleep, play, leisure, and social participation.   COMORBIDITIES: may have co-morbidities  that affects occupational performance. Patient will benefit from skilled OT to address above impairments and improve overall function.  MODIFICATION OR ASSISTANCE TO COMPLETE EVALUATION: Min-Moderate  modification of tasks or assist with assess necessary to complete an evaluation.  OT OCCUPATIONAL PROFILE AND HISTORY: Detailed assessment: Review of records and additional review of physical, cognitive, psychosocial history related to current functional performance.  CLINICAL DECISION MAKING: Moderate - several treatment options, min-mod task modification necessary  REHAB POTENTIAL: Good for digits- fair for R trigger thumb  EVALUATION COMPLEXITY: Moderate      PLAN:  OT FREQUENCY: 2x/week  OT DURATION: 8 weeks  PLANNED INTERVENTIONS: self care/ADL training, therapeutic exercise, manual therapy, passive range of motion, splinting, ultrasound, iontophoresis, paraffin, fluidotherapy, moist heat, cryotherapy, contrast bath, patient/family education, coping strategies training, and DME and/or AE instructions     CONSULTED AND AGREED WITH PLAN OF CARE: Patient     Oletta Cohn, OTR/L,CLT 11/16/2022, 2:00 PM

## 2022-11-27 ENCOUNTER — Ambulatory Visit: Payer: BC Managed Care – PPO | Admitting: Occupational Therapy

## 2022-11-27 DIAGNOSIS — M79642 Pain in left hand: Secondary | ICD-10-CM

## 2022-11-27 DIAGNOSIS — M25641 Stiffness of right hand, not elsewhere classified: Secondary | ICD-10-CM

## 2022-11-27 DIAGNOSIS — M65311 Trigger thumb, right thumb: Secondary | ICD-10-CM

## 2022-11-27 DIAGNOSIS — M79641 Pain in right hand: Secondary | ICD-10-CM

## 2022-11-27 DIAGNOSIS — M6281 Muscle weakness (generalized): Secondary | ICD-10-CM

## 2022-11-27 DIAGNOSIS — M25642 Stiffness of left hand, not elsewhere classified: Secondary | ICD-10-CM

## 2022-11-27 NOTE — Therapy (Signed)
OUTPATIENT OCCUPATIONAL THERAPY ORTHO TREATMENT  Patient Name: Valerie Underwood MRN: [id number] DOB:12/17/1970, 51 y.o., female Today's Date: 11/27/2022  PCP: Merlinda Frederick NP REFERRING PROVIDER: Defoor PA  END OF SESSION:  OT End of Session - 11/27/22 1433     Visit Number 2    Number of Visits 10    Date for OT Re-Evaluation 01/11/23    OT Start Time 1433    OT Stop Time 1530    OT Time Calculation (min) 57 min    Activity Tolerance Patient tolerated treatment well    Behavior During Therapy WFL for tasks assessed/performed             Past Medical History:  Diagnosis Date   Anemia    Depression    Eczema    History of traumatic brain injury 1997   Seizures (HCC)    hx- after TBI, last sz 2001   Uterine fibroid    history   Past Surgical History:  Procedure Laterality Date   COLONOSCOPY WITH PROPOFOL N/A 09/16/2019   Procedure: COLONOSCOPY WITH PROPOFOL;  Surgeon: Midge Minium, MD;  Location: [facility] ENDOSCOPY;  Service: Endoscopy;  Laterality: N/A;   DILATION AND CURETTAGE OF UTERUS  1988   UTERINE ARTERY EMBOLIZATION  2009   Patient Active Problem List   Diagnosis Date Noted   Encounter for screening colonoscopy    Polyp of transverse colon    Depression, recurrent (HCC) 05/02/2017   Family history of PMS (premenstrual syndrome) 05/17/2016   BMI 40.0-44.9, adult (HCC) 05/17/2016   Metatarsalgia 05/24/2011   Gait abnormality 05/24/2011   Hypertriglyceridemia 01/18/2011   Morbid obesity (HCC) 01/17/2011   Depression 01/17/2011   Anemia    Uterine fibroid    History of traumatic brain injury    Seizures (HCC)    Left foot pain 11/11/2010    ONSET DATE: 2/24  REFERRING DIAG: RA of multiple joints   THERAPY DIAG:  Pain in right hand  Pain in left hand  Stiffness of left hand, not elsewhere classified  Muscle weakness (generalized)  Stiffness of right hand, not elsewhere classified  Trigger thumb of right hand  Rationale for Evaluation and  Treatment: Rehabilitation  SUBJECTIVE:   SUBJECTIVE STATEMENT: My motion is better in my R hand - but had increase pain in the thumb -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 Pain coming in R thumb 8 in R but decrease to less than 5/10   FALLS: Has patient fallen in last 6 months? Yes. 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  Has cats at home likes to read on her tablet, cleaning around the house on her phone about 2 hours a day.  PATIENT GOALS: Want the pain  in my hands better and  the stiffness - keep my motion and strength in my hands to do the things I love and to be independent   NEXT MD VISIT: End of Oct  OBJECTIVE:  Note: Objective measures were completed at Evaluation unless otherwise noted.  HAND DOMINANCE: Right    UPPER EXTREMITY ROM:     Active ROM Right eval Left eval  Shoulder flexion    Shoulder abduction    Shoulder adduction    Shoulder extension    Shoulder internal rotation    Shoulder external rotation    Elbow flexion    Elbow extension    Wrist flexion 70 70  Wrist extension 80 80  Wrist ulnar deviation 30 30  Wrist radial deviation 20 1/10 pain  20  Wrist pronation    Wrist supination    (Blank rows = not tested)  Active ROM Right eval Left eval R 11/27/22   Thumb MCP (0-60)     Thumb IP (0-80) -20 ext - locking - pain   0 ext  Thumb Radial abd/add (0-55) 50 60 60  Thumb Palmar abd/add (0-45) 70 60 70  Thumb Opposition to Small Finger Opposition to base of 5th - pain      Index MCP (0-90) 80  85   Index PIP (0-100) 95 95    Index DIP (0-70)       Long MCP (0-90) 80   90   Long PIP (0-100) 100  100   Long DIP (0-70)       Ring MCP (0-90) 80  90    Ring PIP (0-100) 100  100    Ring DIP (0-70)       Little MCP (0-90) 80  90    Little PIP (0-100)  100 95    Little DIP (0-70)       (Blank rows = not tested)   UPPER EXTREMITY MMT:  Strength in wrist in all planes - 4+/5 - no pain     HAND FUNCTION: EVAL : Grip strength: Right: 22 lbs; Left: 55 lbs, Lateral pinch: Right: 8 pain 5/10 lbs, Left: 11 lbs, and 3 point pinch: Right: 11 lbs, Left: 11 lbs   SENSATION: Pt report CT symptoms- numbness but splints helps at night time- but when gripping object , pinching can go numb   EDEMA:   R thumb and MC's at R hand   COGNITION: Overall cognitive status: Within functional limits for tasks assessed       TODAY'S TREATMENT:                                                                                                                              DATE: 10/14 /24 Fluidotherapy done to left hand with 2 ice rotation and contrast to the right-afterwards review gentle active range of motion  for thumb PA and RA - maintain progress and AROM but do not push pain more than  1/10 - cont with  tendon glides. 12 reps 2 times a day after modalities at home pain-free Opposition to all digits with the left hand but hold off on the right because of thumb still triggering Ice massage several times a day right thumb A1 pulley Cont gutter splint fabricated for right thumb IP to increase extension and decrease locking of thumb IP. To wear is much as she can. Remove during contrast or modalities and active range of motion. Reviewed  last visit with patient  handouts and provided for joint protection and modifications. Ionto with dexamethazone for R thumb A1pulley - 20 min - med patch at 2.0 current - tolerate well and skin check done prior and afterwards    PATIENT EDUCATION: Education details: findings of eval and HEP -joint protection and modifications Person educated: Patient Education method: Explanation, Demonstration, Tactile cues, Verbal cues, and Handouts Education comprehension: verbalized understanding, returned demonstration, verbal cues required, and needs further education    GOALS: Goals reviewed with patient? Yes  SHORT TERM GOALS: Target date: 2 wks  Pain and tenderness decreased in right thumb to less than a 5/10 patient able to maintain thumb IP extension and resting without splint. Baseline: Patient unable to maintain IP extension of right thumb.  locking at -20 degrees-tenderness over thumb A1 pulley 5-8/10 as well as with passive extension of thumb IP Goal status: INITIAL    LONG TERM GOALS: Target date: 8 wk  Patient to be independent in home program to maintain digits active range of bilateral hands within normal limits and symptom-free including left thumb Baseline: Patient with increased stiffness and tightness over right metacarpals and decrease motion, left hand stiffness and tightness with pain can increase at the most to 8/10 in the right hand and 5/10 in the left hand Goal status: INITIAL  2.  Right thumb active range of motion increased to within functional limits and basic ADLs with triggering less than 1 time a day  baseline: Right thumb locks to -20 degrees extension at rest ;unable to maintain full extension.  Tenderness over the A1 pulley 5/10 Goal status: INITIAL  3.  Right grip strength increased to more than 60% compared to the left for patient to be able to grip utensils, brush and toothbrush without increase symptoms Baseline: Grip on the right dominant hand 22 pounds and left 55 pounds-patient  report difficulty gripping objects Goal status: INITIAL  4.  Patient to verbalize 3 joint protection and modifications as well as adaptive equipment to decrease pain in bilateral hands as well as triggering. 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  Patient present at OT evaluation 2 wks ago with increased pain in bilateral hands with the right worse than the left.  5-8/10 pain with functional use.  Patient has a history of bilateral carpal tunnel symptoms.  Patient reports she sleeps with her wrist braces and has numbness with sustained grip during the day with certain activities.  Patient with increased stiffness and tightness in bilateral hands over MCPs and PIPs with composite fisting.  Left thumb active range of motion within normal limits.  Pt arrive this date for first follow up great progress in thumb IP ext and PA /RA WNL - but increase  pain - R thumb still trigger  and tender over A1 pulley . Ionto initiate this date - tolerate well.  Active range of motion within functional limits with stiffness more than pain.  Patient with decreased grip and prehension strength on the right dominant hand.  Patient limited in performing ADLs and IADLs involving gripping, pinching, lifting, pulling and carrying.  PERFORMANCE DEFICITS: in functional skills including ADLs, IADLs, coordination, dexterity, sensation, ROM, strength, pain, flexibility, Fine motor control, decreased knowledge of use of DME, and UE functional use, , and psychosocial skills including environmental adaptation and routines and behaviors.   IMPAIRMENTS: are limiting patient from ADLs, IADLs, rest and sleep,  play, leisure, and social participation.   COMORBIDITIES: may have co-morbidities  that affects occupational performance. Patient will benefit from skilled OT to address above impairments and improve overall function.  MODIFICATION OR ASSISTANCE TO COMPLETE EVALUATION: Min-Moderate modification of tasks or assist with assess necessary to complete an evaluation.  OT OCCUPATIONAL PROFILE AND HISTORY: Detailed assessment: Review of records and additional review of physical, cognitive, psychosocial history related to current functional performance.  CLINICAL DECISION MAKING: Moderate - several treatment options, min-mod task modification necessary  REHAB POTENTIAL: Good for digits- fair for R trigger thumb  EVALUATION COMPLEXITY: Moderate      PLAN:  OT FREQUENCY: 2x/week  OT DURATION: 8 weeks  PLANNED INTERVENTIONS: self care/ADL training, therapeutic exercise, manual therapy, passive range of motion, splinting, ultrasound, iontophoresis, paraffin, fluidotherapy, moist heat, cryotherapy, contrast bath, patient/family education, coping strategies training, and DME and/or AE instructions     CONSULTED AND AGREED WITH PLAN OF CARE: Patient     Oletta Cohn, OTR/L,CLT 11/27/2022, 3:54 PM

## 2022-11-30 ENCOUNTER — Ambulatory Visit: Payer: BC Managed Care – PPO | Admitting: Occupational Therapy

## 2022-11-30 DIAGNOSIS — M25641 Stiffness of right hand, not elsewhere classified: Secondary | ICD-10-CM

## 2022-11-30 DIAGNOSIS — M79641 Pain in right hand: Secondary | ICD-10-CM

## 2022-11-30 DIAGNOSIS — M79642 Pain in left hand: Secondary | ICD-10-CM

## 2022-11-30 DIAGNOSIS — M6281 Muscle weakness (generalized): Secondary | ICD-10-CM

## 2022-11-30 DIAGNOSIS — M25642 Stiffness of left hand, not elsewhere classified: Secondary | ICD-10-CM

## 2022-11-30 DIAGNOSIS — M65311 Trigger thumb, right thumb: Secondary | ICD-10-CM

## 2022-11-30 NOTE — Therapy (Signed)
OUTPATIENT OCCUPATIONAL THERAPY ORTHO TREATMENT  Patient Name: Valerie Underwood MRN: [id number] DOB:May 22, 1970, 52 y.o., female Today's Date: 11/30/2022  PCP: Merlinda Frederick NP REFERRING PROVIDER: Defoor PA  END OF SESSION:  OT End of Session - 11/30/22 1431     Visit Number 3    Number of Visits 10    Date for OT Re-Evaluation 01/11/23    OT Start Time 1129    OT Stop Time 1211    OT Time Calculation (min) 42 min    Activity Tolerance Patient tolerated treatment well    Behavior During Therapy WFL for tasks assessed/performed             Past Medical History:  Diagnosis Date   Anemia    Depression    Eczema    History of traumatic brain injury 1997   Seizures (HCC)    hx- after TBI, last sz 2001   Uterine fibroid    history   Past Surgical History:  Procedure Laterality Date   COLONOSCOPY WITH PROPOFOL N/A 09/16/2019   Procedure: COLONOSCOPY WITH PROPOFOL;  Surgeon: Midge Minium, MD;  Location: [facility] ENDOSCOPY;  Service: Endoscopy;  Laterality: N/A;   DILATION AND CURETTAGE OF UTERUS  1988   UTERINE ARTERY EMBOLIZATION  2009   Patient Active Problem List   Diagnosis Date Noted   Encounter for screening colonoscopy    Polyp of transverse colon    Depression, recurrent (HCC) 05/02/2017   Family history of PMS (premenstrual syndrome) 05/17/2016   BMI 40.0-44.9, adult (HCC) 05/17/2016   Metatarsalgia 05/24/2011   Gait abnormality 05/24/2011   Hypertriglyceridemia 01/18/2011   Morbid obesity (HCC) 01/17/2011   Depression 01/17/2011   Anemia    Uterine fibroid    History of traumatic brain injury    Seizures (HCC)    Left foot pain 11/11/2010    ONSET DATE: 2/24  REFERRING DIAG: RA of multiple joints   THERAPY DIAG:  Pain in right hand  Pain in left hand  Stiffness of left hand, not elsewhere classified  Muscle weakness (generalized)  Stiffness of right hand, not elsewhere classified  Trigger thumb of right hand  Rationale for Evaluation and  Treatment: Rehabilitation  SUBJECTIVE:   SUBJECTIVE STATEMENT: I could tell my pain was better after that medication - thumb pain there but not as bad and motion still better than what it was Pt accompanied by: self  PERTINENT HISTORY: Last appt with Defoor PA - 11/02/22 - Pt Plaquenil since 09/07/22 - but cont to have some flare ups- thumb pain , increase swelling and and morning stiffness - Pt now on light dose of Methotrexate - 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 Pain coming in R thumb 4/10  FALLS: Has patient fallen in last 6 months? Yes. 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  Has cats at home likes to read on her tablet, cleaning around the house on her phone about 2 hours a day.  PATIENT GOALS: Want the pain  in my hands better and  the stiffness - keep my motion and strength in my hands to do the things I love and to be independent   NEXT  MD VISIT: End of Oct  OBJECTIVE:  Note: Objective measures were completed at Evaluation unless otherwise noted.  HAND DOMINANCE: Right    UPPER EXTREMITY ROM:     Active ROM Right eval Left eval  Shoulder flexion    Shoulder abduction    Shoulder adduction    Shoulder extension    Shoulder internal rotation    Shoulder external rotation    Elbow flexion    Elbow extension    Wrist flexion 70 70  Wrist extension 80 80  Wrist ulnar deviation 30 30  Wrist radial deviation 20 1/10 pain  20  Wrist pronation    Wrist supination    (Blank rows = not tested)  Active ROM Right eval Left eval R 11/27/22  Thumb MCP (0-60)     Thumb IP (0-80) -20 ext - locking - pain   0 ext  Thumb Radial abd/add (0-55) 50 60 60  Thumb Palmar abd/add  (0-45) 70 60 70  Thumb Opposition to Small Finger Opposition to base of 5th - pain      Index MCP (0-90) 80  85   Index PIP (0-100) 95 95    Index DIP (0-70)       Long MCP (0-90) 80   90   Long PIP (0-100) 100  100   Long DIP (0-70)       Ring MCP (0-90) 80  90    Ring PIP (0-100) 100  100    Ring DIP (0-70)       Little MCP (0-90) 80  90    Little PIP (0-100)  100 95    Little DIP (0-70)       (Blank rows = not tested)   UPPER EXTREMITY MMT:  Strength in wrist in all planes - 4+/5 - no pain     HAND FUNCTION: EVAL : Grip strength: Right: 22 lbs; Left: 55 lbs, Lateral pinch: Right: 8 pain 5/10 lbs, Left: 11 lbs, and 3 point pinch: Right: 11 lbs, Left: 11 lbs   SENSATION: Pt report CT symptoms- numbness but splints helps at night time- but when gripping object , pinching can go numb   EDEMA:   R thumb and MC's at R hand   COGNITION: Overall cognitive status: Within functional limits for tasks assessed       TODAY'S TREATMENT:                                                                                                                              DATE: 10/17 /24 Contrast  to the right hand with soft tissue mobs to webspace and MC spreads-   review gentle active range of motion  for thumb PA and RA  with ADD inbetween- maintain progress and AROM but do not push pain more than 1/10 - cont with  tendon glides. 12 reps 2 times a day after modalities at home pain-free Opposition to all digits with the  left hand but hold off on the right because of thumb still triggering Ice massage several times a day right thumb A1 pulley Cont gutter splint fabricated for right thumb IP to increase extension and decrease locking of thumb IP. To wear is much as she can. Remove during contrast or modalities and active range of motion. Reviewed  at eval with patient handouts and provided for joint protection and modifications. 2nd session done today for  Ionto with dexamethazone for R thumb  A1pulley - 20 min - med patch at 2.0 current - tolerate well and skin check done prior pt to keep patch on for hour afterwards   PATIENT EDUCATION: Education details: findings of eval and HEP -joint protection and modifications Person educated: Patient Education method: Explanation, Demonstration, Tactile cues, Verbal cues, and Handouts Education comprehension: verbalized understanding, returned demonstration, verbal cues required, and needs further education    GOALS: Goals reviewed with patient? Yes  SHORT TERM GOALS: Target date: 2 wks  Pain and tenderness decreased in right thumb to less than a 5/10 patient able to maintain thumb IP extension and resting without splint. Baseline: Patient unable to maintain IP extension of right thumb.  locking at -20 degrees-tenderness over thumb A1 pulley 5-8/10 as well as with passive extension of thumb IP Goal status: INITIAL    LONG TERM GOALS: Target date: 8 wk  Patient to be independent in home program to maintain digits active range of bilateral hands within normal limits and symptom-free including left thumb Baseline: Patient with increased stiffness and tightness over right metacarpals and decrease motion, left hand stiffness and tightness with pain can increase at the most to 8/10 in the right hand and 5/10 in the left hand Goal status: INITIAL  2.  Right thumb active range of motion increased to within functional limits and basic ADLs with triggering less than 1 time a day  baseline: Right thumb locks to -20 degrees extension at rest ;unable to maintain full extension.  Tenderness over the A1 pulley 5/10 Goal status: INITIAL  3.  Right grip strength increased to more than 60% compared to the left for patient to be able to grip utensils, brush and toothbrush without increase symptoms Baseline: Grip on the right dominant hand 22 pounds and left 55 pounds-patient report difficulty gripping objects Goal status: INITIAL  4.  Patient to  verbalize 3 joint protection and modifications as well as adaptive equipment to decrease pain in bilateral hands as well as triggering. 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  Patient present at OT evaluation 2 wks ago with increased pain in bilateral hands with the right worse than the left.  5-8/10 pain with functional use.  Patient has a history of bilateral carpal tunnel symptoms.  Patient reports she sleeps with her wrist braces and has numbness with sustained grip during the day with certain activities.  Patient with increased stiffness and tightness in bilateral hands over MCPs and PIPs with composite fisting.  Left thumb active range of motion within normal limits.  Pt arrive this week with great progress in thumb IP ext and PA /RA WNL - but pain still increase in R thumb  - still triggering and tender over A1 pulley.  2nd  session Ionto  done this date - tolerate well.  Active range of motion within functional limits with stiffness more than pain in L hand and bilateral wrist - R digits BUT NOT R thumb.  Patient with decreased grip and prehension strength on the right dominant hand.  Patient limited in performing ADLs and IADLs involving gripping, pinching, lifting, pulling and carrying.  PERFORMANCE DEFICITS: in functional skills including ADLs, IADLs, coordination, dexterity, sensation, ROM, strength, pain, flexibility, Fine motor control, decreased knowledge of use of DME, and UE functional use, , and psychosocial skills including environmental adaptation and routines and behaviors.   IMPAIRMENTS: are limiting patient from ADLs, IADLs, rest and sleep, play,  leisure, and social participation.   COMORBIDITIES: may have co-morbidities  that affects occupational performance. Patient will benefit from skilled OT to address above impairments and improve overall function.  MODIFICATION OR ASSISTANCE TO COMPLETE EVALUATION: Min-Moderate modification of tasks or assist with assess necessary to complete an evaluation.  OT OCCUPATIONAL PROFILE AND HISTORY: Detailed assessment: Review of records and additional review of physical, cognitive, psychosocial history related to current functional performance.  CLINICAL DECISION MAKING: Moderate - several treatment options, min-mod task modification necessary  REHAB POTENTIAL: Good for digits- fair for R trigger thumb  EVALUATION COMPLEXITY: Moderate      PLAN:  OT FREQUENCY: 2x/week  OT DURATION: 8 weeks  PLANNED INTERVENTIONS: self care/ADL training, therapeutic exercise, manual therapy, passive range of motion, splinting, ultrasound, iontophoresis, paraffin, fluidotherapy, moist heat, cryotherapy, contrast bath, patient/family education, coping strategies training, and DME and/or AE instructions     CONSULTED AND AGREED WITH PLAN OF CARE: Patient     Oletta Cohn, OTR/L,CLT 11/30/2022, 2:33 PM

## 2022-12-04 ENCOUNTER — Ambulatory Visit: Payer: BC Managed Care – PPO | Admitting: Occupational Therapy

## 2022-12-05 ENCOUNTER — Ambulatory Visit: Payer: BC Managed Care – PPO | Admitting: Occupational Therapy

## 2022-12-05 DIAGNOSIS — M79641 Pain in right hand: Secondary | ICD-10-CM | POA: Diagnosis not present

## 2022-12-05 DIAGNOSIS — M6281 Muscle weakness (generalized): Secondary | ICD-10-CM

## 2022-12-05 DIAGNOSIS — M65311 Trigger thumb, right thumb: Secondary | ICD-10-CM

## 2022-12-05 DIAGNOSIS — M25642 Stiffness of left hand, not elsewhere classified: Secondary | ICD-10-CM

## 2022-12-05 DIAGNOSIS — M79642 Pain in left hand: Secondary | ICD-10-CM

## 2022-12-05 NOTE — Therapy (Signed)
OUTPATIENT OCCUPATIONAL THERAPY ORTHO TREATMENT  Patient Name: Valerie Underwood MRN: [id number] DOB:05/28/1970, 52 y.o., female Today's Date: 12/05/2022  PCP: Merlinda Frederick NP REFERRING PROVIDER: Defoor PA  END OF SESSION:  OT End of Session - 12/05/22 1504     Visit Number 4    Number of Visits 10    Date for OT Re-Evaluation 01/11/23    OT Start Time 1400    OT Stop Time 1454    OT Time Calculation (min) 54 min    Activity Tolerance Patient tolerated treatment well    Behavior During Therapy WFL for tasks assessed/performed             Past Medical History:  Diagnosis Date   Anemia    Depression    Eczema    History of traumatic brain injury 1997   Seizures (HCC)    hx- after TBI, last sz 2001   Uterine fibroid    history   Past Surgical History:  Procedure Laterality Date   COLONOSCOPY WITH PROPOFOL N/A 09/16/2019   Procedure: COLONOSCOPY WITH PROPOFOL;  Surgeon: Midge Minium, MD;  Location: [facility] ENDOSCOPY;  Service: Endoscopy;  Laterality: N/A;   DILATION AND CURETTAGE OF UTERUS  1988   UTERINE ARTERY EMBOLIZATION  2009   Patient Active Problem List   Diagnosis Date Noted   Encounter for screening colonoscopy    Polyp of transverse colon    Depression, recurrent (HCC) 05/02/2017   Family history of PMS (premenstrual syndrome) 05/17/2016   BMI 40.0-44.9, adult (HCC) 05/17/2016   Metatarsalgia 05/24/2011   Gait abnormality 05/24/2011   Hypertriglyceridemia 01/18/2011   Morbid obesity (HCC) 01/17/2011   Depression 01/17/2011   Anemia    Uterine fibroid    History of traumatic brain injury    Seizures (HCC)    Left foot pain 11/11/2010    ONSET DATE: 2/24  REFERRING DIAG: RA of multiple joints   THERAPY DIAG:  Pain in right hand  Pain in left hand  Stiffness of left hand, not elsewhere classified  Muscle weakness (generalized)  Trigger thumb of right hand  Rationale for Evaluation and Treatment: Rehabilitation  SUBJECTIVE:   SUBJECTIVE  STATEMENT: My hand and thumb is so much better- but thumb still locks and is tender - but I had COVID and FLU shot Friday and we were soo sick this weekend Pt accompanied by: self  PERTINENT HISTORY: Last appt with Defoor PA - 11/02/22 - Pt Plaquenil since 09/07/22 - but cont to have some flare ups- thumb pain , increase swelling and and morning stiffness - Pt now on light dose of Methotrexate - 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 Pain coming in R thumb 5/10- tenderness over A1pulley  FALLS: Has patient fallen in last 6 months? Yes. 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  Has cats at home likes to read on her tablet, cleaning around the house on her phone about 2 hours a day.  PATIENT GOALS: Want the pain  in my hands better and  the stiffness - keep my motion and strength in my hands to do the things I love and to be independent   NEXT  MD VISIT: End of Oct  OBJECTIVE:  Note: Objective measures were completed at Evaluation unless otherwise noted.  HAND DOMINANCE: Right    UPPER EXTREMITY ROM:     Active ROM Right eval Left eval  Shoulder flexion    Shoulder abduction    Shoulder adduction    Shoulder extension    Shoulder internal rotation    Shoulder external rotation    Elbow flexion    Elbow extension    Wrist flexion 70 70  Wrist extension 80 80  Wrist ulnar deviation 30 30  Wrist radial deviation 20 1/10 pain  20  Wrist pronation    Wrist supination    (Blank rows = not tested)  Active ROM Right eval Left eval R 11/27/22  Thumb MCP (0-60)     Thumb IP (0-80) -20 ext - locking - pain   0 ext  Thumb Radial abd/add (0-55) 50 60 60  Thumb Palmar abd/add (0-45) 70 60 70   Thumb Opposition to Small Finger Opposition to base of 5th - pain      Index MCP (0-90) 80  85   Index PIP (0-100) 95 95    Index DIP (0-70)       Long MCP (0-90) 80   90   Long PIP (0-100) 100  100   Long DIP (0-70)       Ring MCP (0-90) 80  90    Ring PIP (0-100) 100  100    Ring DIP (0-70)       Little MCP (0-90) 80  90    Little PIP (0-100)  100 95    Little DIP (0-70)       (Blank rows = not tested)   UPPER EXTREMITY MMT:  Strength in wrist in all planes - 4+/5 - no pain     HAND FUNCTION: EVAL : Grip strength: Right: 22 lbs; Left: 55 lbs, Lateral pinch: Right: 8 pain 5/10 lbs, Left: 11 lbs, and 3 point pinch: Right: 11 lbs, Left: 11 lbs  12/05/22: Grip strength: Right: 50 lbs pain ; Left: 55 lbs, Lateral pinch: Right: 8 lbs pain 3/10,  Left: 11 lbs, and 3 point pinch: Right: 8 lbs, Left: 11 lbs   SENSATION: Pt report CT symptoms- numbness but splints helps at night time- but when gripping object , pinching can go numb   EDEMA:   R thumb and MC's at R hand   COGNITION: Overall cognitive status: Within functional limits for tasks assessed       TODAY'S TREATMENT:                                                                                                                              DATE: 10/22 /24 Contrast  to the right hand with soft tissue mobs to webspace and MC spreads-   Great progress in active range of motion  for thumb PA and RA  WNL - pain free  with ADD inbetween- cont with  tendon glides. 12 reps 2 times a day after modalities at home pain-free Opposition to all digits with the left hand but hold off on the right because of thumb still triggering Ice massage several times a day right thumb A1 pulley Cont gutter splint fabricated for right thumb IP to maintain IP extension in rest and decrease locking of thumb IP with use. To wear is much as she can. Remove during contrast or modalities and active range of motion. Can do gentle PROM of thumb IP and MC  - pain free and no locking 5-8 reps 3rd session done today with Ionto with dexamethazone for R thumb A1pulley - 20 min - med patch at 2.0 current - tolerate well and skin check done prior pt to keep patch on for hour afterwards   PATIENT EDUCATION: Education details: findings of eval and HEP -joint protection and modifications Person educated: Patient Education method: Explanation, Demonstration, Tactile cues, Verbal cues, and Handouts Education comprehension: verbalized understanding, returned demonstration, verbal cues required, and needs further education    GOALS: Goals reviewed with patient? Yes  SHORT TERM GOALS: Target date: 2 wks  Pain and tenderness decreased in right thumb to less than a 5/10 patient able to maintain thumb IP extension and resting without splint. Baseline: Patient unable to maintain IP extension of right thumb.  locking at -20 degrees-tenderness over thumb A1 pulley 5-8/10 as well as with passive extension of thumb IP Goal status: INITIAL    LONG TERM GOALS: Target date: 8 wk  Patient to be independent in home program to maintain digits active range of bilateral hands within normal limits and symptom-free including left thumb Baseline: Patient with increased stiffness and tightness over right metacarpals and decrease motion, left hand stiffness and tightness with pain can increase at the most to 8/10 in the right hand and 5/10 in the left hand Goal status: INITIAL  2.  Right thumb active range of motion increased to within functional limits and basic ADLs with triggering less than 1 time a day  baseline: Right thumb locks to -20 degrees extension at rest ;unable to maintain full extension.  Tenderness over the A1 pulley 5/10 Goal status: INITIAL  3.  Right grip strength increased to more than 60% compared to the left for patient to be able to grip utensils, brush and toothbrush without increase symptoms Baseline: Grip on the right dominant hand 22 pounds  and left 55 pounds-patient report difficulty gripping objects Goal status: INITIAL  4.  Patient to verbalize 3 joint protection and modifications as well as adaptive equipment to decrease pain in bilateral hands as well as triggering. 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  Patient present at OT evaluation 2 wks ago with increased pain in bilateral hands with the right worse than the left.  5-8/10 pain with functional use.  Patient has a history of bilateral carpal tunnel symptoms.  Patient reports she sleeps with her wrist braces and has numbness with sustained grip during the day with certain activities.  Patient with increased stiffness and tightness in bilateral hands over MCPs and PIPs with composite fisting.  Left thumb active range of motion within normal limits.  Pt arrive  with report of being very sick over the weekend after flu and Covid shot - but bil hands stiffness and pain better- per pt - R thumb PA /RA WNL -  pain free- cont to show tenderness over A1 pulley and triggering at R thumb IP - 3rd session Ionto  done this date - tolerate well.  Active range of motion within functional limits with stiffness more than pain in L hand and bilateral wrist - R digits BUT NOT R thumb.  Patient with decreased grip and prehension strength on the right dominant hand.  Patient limited in performing ADLs and IADLs involving gripping, pinching, lifting, pulling and carrying.  PERFORMANCE DEFICITS: in functional skills including ADLs, IADLs, coordination, dexterity, sensation, ROM, strength, pain, flexibility, Fine motor control, decreased knowledge of use of DME, and UE  functional use, , and psychosocial skills including environmental adaptation and routines and behaviors.   IMPAIRMENTS: are limiting patient from ADLs, IADLs, rest and sleep, play, leisure, and social participation.   COMORBIDITIES: may have co-morbidities  that affects occupational performance. Patient will benefit from skilled OT to address above impairments and improve overall function.  MODIFICATION OR ASSISTANCE TO COMPLETE EVALUATION: Min-Moderate modification of tasks or assist with assess necessary to complete an evaluation.  OT OCCUPATIONAL PROFILE AND HISTORY: Detailed assessment: Review of records and additional review of physical, cognitive, psychosocial history related to current functional performance.  CLINICAL DECISION MAKING: Moderate - several treatment options, min-mod task modification necessary  REHAB POTENTIAL: Good for digits- fair for R trigger thumb  EVALUATION COMPLEXITY: Moderate      PLAN:  OT FREQUENCY: 2x/week  OT DURATION: 8 weeks  PLANNED INTERVENTIONS: self care/ADL training, therapeutic exercise, manual therapy, passive range of motion, splinting, ultrasound, iontophoresis, paraffin, fluidotherapy, moist heat, cryotherapy, contrast bath, patient/family education, coping strategies training, and DME and/or AE instructions     CONSULTED AND AGREED WITH PLAN OF CARE: Patient     Oletta Cohn, OTR/L,CLT 12/05/2022, 3:06 PM

## 2022-12-07 ENCOUNTER — Ambulatory Visit: Payer: BC Managed Care – PPO | Admitting: Occupational Therapy

## 2022-12-07 DIAGNOSIS — M25642 Stiffness of left hand, not elsewhere classified: Secondary | ICD-10-CM

## 2022-12-07 DIAGNOSIS — M79641 Pain in right hand: Secondary | ICD-10-CM

## 2022-12-07 DIAGNOSIS — M65311 Trigger thumb, right thumb: Secondary | ICD-10-CM

## 2022-12-07 DIAGNOSIS — M25641 Stiffness of right hand, not elsewhere classified: Secondary | ICD-10-CM

## 2022-12-07 DIAGNOSIS — M79642 Pain in left hand: Secondary | ICD-10-CM

## 2022-12-07 NOTE — Therapy (Signed)
OUTPATIENT OCCUPATIONAL THERAPY ORTHO TREATMENT  Patient Name: Valerie Underwood MRN: [id number] DOB:1970-11-03, 52 y.o., female Today's Date: 12/07/2022  PCP: Merlinda Frederick NP REFERRING PROVIDER: Defoor PA  END OF SESSION:  OT End of Session - 12/07/22 1117     Visit Number 5    Number of Visits 10    Date for OT Re-Evaluation 01/11/23    OT Start Time 1117    OT Stop Time 1205    OT Time Calculation (min) 48 min    Activity Tolerance Patient tolerated treatment well    Behavior During Therapy WFL for tasks assessed/performed             Past Medical History:  Diagnosis Date   Anemia    Depression    Eczema    History of traumatic brain injury 1997   Seizures (HCC)    hx- after TBI, last sz 2001   Uterine fibroid    history   Past Surgical History:  Procedure Laterality Date   COLONOSCOPY WITH PROPOFOL N/A 09/16/2019   Procedure: COLONOSCOPY WITH PROPOFOL;  Surgeon: Midge Minium, MD;  Location: [facility] ENDOSCOPY;  Service: Endoscopy;  Laterality: N/A;   DILATION AND CURETTAGE OF UTERUS  1988   UTERINE ARTERY EMBOLIZATION  2009   Patient Active Problem List   Diagnosis Date Noted   Encounter for screening colonoscopy    Polyp of transverse colon    Depression, recurrent (HCC) 05/02/2017   Family history of PMS (premenstrual syndrome) 05/17/2016   BMI 40.0-44.9, adult (HCC) 05/17/2016   Metatarsalgia 05/24/2011   Gait abnormality 05/24/2011   Hypertriglyceridemia 01/18/2011   Morbid obesity (HCC) 01/17/2011   Depression 01/17/2011   Anemia    Uterine fibroid    History of traumatic brain injury    Seizures (HCC)    Left foot pain 11/11/2010    ONSET DATE: 2/24  REFERRING DIAG: RA of multiple joints   THERAPY DIAG:  Pain in right hand  Stiffness of left hand, not elsewhere classified  Trigger thumb of right hand  Pain in left hand  Stiffness of right hand, not elsewhere classified  Rationale for Evaluation and Treatment: Rehabilitation  SUBJECTIVE:    SUBJECTIVE STATEMENT: My R hand been bothering me more today  and my finger.  I did blow some leaves and used my hand around the house.  I should have probably not.   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 Pain coming in R thumb 5/10- tenderness over A1pulley and thumb CMC  FALLS: Has patient fallen in last 6 months? Yes. 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  Has cats at home likes to read on her tablet, cleaning around the house on her phone about 2 hours a day.  PATIENT GOALS: Want the pain  in my hands better and  the stiffness - keep my motion and strength in my hands to do the things  I love and to be independent   NEXT MD VISIT: End of Oct  OBJECTIVE:  Note: Objective measures were completed at Evaluation unless otherwise noted.  HAND DOMINANCE: Right    UPPER EXTREMITY ROM:     Active ROM Right eval Left eval  Shoulder flexion    Shoulder abduction    Shoulder adduction    Shoulder extension    Shoulder internal rotation    Shoulder external rotation    Elbow flexion    Elbow extension    Wrist flexion 70 70  Wrist extension 80 80  Wrist ulnar deviation 30 30  Wrist radial deviation 20 1/10 pain  20  Wrist pronation    Wrist supination    (Blank rows = not tested)  Active ROM Right eval Left eval R 11/27/22  Thumb MCP (0-60)     Thumb IP (0-80) -20 ext - locking - pain   0 ext  Thumb Radial abd/add (0-55) 50 60 60  Thumb  Palmar abd/add (0-45) 70 60 70  Thumb Opposition to Small Finger Opposition to base of 5th - pain      Index MCP (0-90) 80  85   Index PIP (0-100) 95 95    Index DIP (0-70)       Long MCP (0-90) 80   90   Long PIP (0-100) 100  100   Long DIP (0-70)       Ring MCP (0-90) 80  90    Ring PIP (0-100) 100  100    Ring DIP (0-70)       Little MCP (0-90) 80  90    Little PIP (0-100)  100 95    Little DIP (0-70)       (Blank rows = not tested)   UPPER EXTREMITY MMT:  Strength in wrist in all planes - 4+/5 - no pain     HAND FUNCTION: EVAL : Grip strength: Right: 22 lbs; Left: 55 lbs, Lateral pinch: Right: 8 pain 5/10 lbs, Left: 11 lbs, and 3 point pinch: Right: 11 lbs, Left: 11 lbs  12/05/22: Grip strength: Right: 50 lbs pain ; Left: 55 lbs, Lateral pinch: Right: 8 lbs pain 3/10,  Left: 11 lbs, and 3 point pinch: Right: 8 lbs, Left: 11 lbs   SENSATION: Pt report CT symptoms- numbness but splints helps at night time- but when gripping object , pinching can go numb   EDEMA:   R thumb and MC's at R hand   COGNITION: Overall cognitive status: Within functional limits for tasks assessed       TODAY'S TREATMENT:                                                                                                                              DATE: 10/24 /24 Contrast  to the right hand with soft tissue mobs to webspace and MC spreads-   Great progress in active range of motion  for  thumb PA and RA  WNL - pain free with ADD inbetween- cont with  tendon glides. 12 reps 2 times a day after modalities at home pain-free Opposition to all digits with the left hand but hold off on the right because of thumb still triggering Ice massage several times a day right thumb A1 pulley Pt able to keep thumb IP extended but in relax position cont to flex and trigger Pt to try and do double bandaid to maintain IP extention during day and night time  Off for HEP  Gutter splint not able to keep on with  activities  To wear is much as she can. Remove during contrast or modalities and active range of motion.  HOLD OFF ON gentle PROM of thumb IP and MC - pain free and no locking 5-8 reps 4th  session done today with Ionto with dexamethazone for R thumb A1pulley - 19 min - med patch at 2.0 current - tolerate well and skin check done prior pt to keep patch on for hour afterwards   PATIENT EDUCATION: Education details: findings of eval and HEP -joint protection and modifications Person educated: Patient Education method: Explanation, Demonstration, Tactile cues, Verbal cues, and Handouts Education comprehension: verbalized understanding, returned demonstration, verbal cues required, and needs further education    GOALS: Goals reviewed with patient? Yes  SHORT TERM GOALS: Target date: 2 wks  Pain and tenderness decreased in right thumb to less than a 5/10 patient able to maintain thumb IP extension and resting without splint. Baseline: Patient unable to maintain IP extension of right thumb.  locking at -20 degrees-tenderness over thumb A1 pulley 5-8/10 as well as with passive extension of thumb IP Goal status: INITIAL    LONG TERM GOALS: Target date: 8 wk  Patient to be independent in home program to maintain digits active range of bilateral hands within normal limits and symptom-free including left thumb Baseline: Patient with increased stiffness and tightness over right metacarpals and decrease motion, left hand stiffness and tightness with pain can increase at the most to 8/10 in the right hand and 5/10 in the left hand Goal status: INITIAL  2.  Right thumb active range of motion increased to within functional limits and basic ADLs with triggering less than 1 time a day  baseline: Right thumb locks to -20 degrees extension at rest ;unable to maintain full extension.  Tenderness over the A1 pulley 5/10 Goal status: INITIAL  3.  Right grip strength increased to more than 60% compared  to the left for patient to be able to grip utensils, brush and toothbrush without increase symptoms Baseline: Grip on the right dominant hand 22 pounds and left 55 pounds-patient report difficulty gripping objects Goal status: INITIAL  4.  Patient to verbalize 3 joint protection and modifications as well as adaptive equipment to decrease pain in bilateral hands as well as triggering. 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  Patient present at OT evaluation with increased pain in bilateral hands with the right worse than the left.  5-8/10 pain with functional use.  Patient has a history of bilateral carpal tunnel symptoms.  Patient reports she sleeps with her wrist braces and has numbness with sustained grip during the day  with certain activities.  Patient with increased stiffness and tightness in bilateral hands over MCPs and PIPs with composite fisting.  Left thumb active range of motion within normal limits.  R thumb PA /RA WNL -  this date increase pain at R thumb CMC -but pt done some yard work and act around the house - Pt cont to show tenderness over A1 pulley  5/10 and triggering at R thumb IP - 4th  session Ionto  done this date - tolerate well.  Pt to use double bandaid during day and night time to keep thumb IP in extention - able to get thumb AROM extention but not able to maintain when relax - still triggering when flexed.  Active range of motion within functional limits with stiffness more than pain in L hand and bilateral wrist.  Patient with decreased grip and prehension strength on the right dominant hand.  Patient limited in performing ADLs and IADLs involving  gripping, pinching, lifting, pulling and carrying.  PERFORMANCE DEFICITS: in functional skills including ADLs, IADLs, coordination, dexterity, sensation, ROM, strength, pain, flexibility, Fine motor control, decreased knowledge of use of DME, and UE functional use, , and psychosocial skills including environmental adaptation and routines and behaviors.   IMPAIRMENTS: are limiting patient from ADLs, IADLs, rest and sleep, play, leisure, and social participation.   COMORBIDITIES: may have co-morbidities  that affects occupational performance. Patient will benefit from skilled OT to address above impairments and improve overall function.  MODIFICATION OR ASSISTANCE TO COMPLETE EVALUATION: Min-Moderate modification of tasks or assist with assess necessary to complete an evaluation.  OT OCCUPATIONAL PROFILE AND HISTORY: Detailed assessment: Review of records and additional review of physical, cognitive, psychosocial history related to current functional performance.  CLINICAL DECISION MAKING: Moderate - several treatment options, min-mod task modification necessary  REHAB POTENTIAL: Good for digits- fair for R trigger thumb  EVALUATION COMPLEXITY: Moderate      PLAN:  OT FREQUENCY: 2x/week  OT DURATION: 8 weeks  PLANNED INTERVENTIONS: self care/ADL training, therapeutic exercise, manual therapy, passive range of motion, splinting, ultrasound, iontophoresis, paraffin, fluidotherapy, moist heat, cryotherapy, contrast bath, patient/family education, coping strategies training, and DME and/or AE instructions     CONSULTED AND AGREED WITH PLAN OF CARE: Patient     Oletta Cohn, OTR/L,CLT 12/07/2022, 11:55 AM

## 2022-12-11 ENCOUNTER — Ambulatory Visit: Payer: BC Managed Care – PPO | Admitting: Occupational Therapy

## 2022-12-19 ENCOUNTER — Ambulatory Visit: Payer: BC Managed Care – PPO | Attending: Rheumatology | Admitting: Occupational Therapy

## 2022-12-19 DIAGNOSIS — M25642 Stiffness of left hand, not elsewhere classified: Secondary | ICD-10-CM | POA: Diagnosis present

## 2022-12-19 DIAGNOSIS — M65311 Trigger thumb, right thumb: Secondary | ICD-10-CM | POA: Diagnosis present

## 2022-12-19 DIAGNOSIS — M79641 Pain in right hand: Secondary | ICD-10-CM | POA: Diagnosis present

## 2022-12-19 DIAGNOSIS — M79642 Pain in left hand: Secondary | ICD-10-CM | POA: Insufficient documentation

## 2022-12-19 DIAGNOSIS — M25641 Stiffness of right hand, not elsewhere classified: Secondary | ICD-10-CM | POA: Diagnosis present

## 2022-12-19 DIAGNOSIS — M6281 Muscle weakness (generalized): Secondary | ICD-10-CM | POA: Insufficient documentation

## 2022-12-19 NOTE — Therapy (Signed)
OUTPATIENT OCCUPATIONAL THERAPY ORTHO TREATMENT  Patient Name: Valerie Underwood MRN: [id number] DOB:[birth year], 52 y.o., female Today's Date: 12/19/2022  PCP: Merlinda Frederick NP REFERRING PROVIDER: Defoor PA  END OF SESSION:  OT End of Session - 12/19/22 1700     Visit Number 6    Number of Visits 10    Date for OT Re-Evaluation 01/11/23    OT Start Time 1621    OT Stop Time 1711    OT Time Calculation (min) 50 min    Activity Tolerance Patient tolerated treatment well    Behavior During Therapy WFL for tasks assessed/performed             Past Medical History:  Diagnosis Date   Anemia    Depression    Eczema    History of traumatic brain injury 1997   Seizures (HCC)    hx- after TBI, last sz 2001   Uterine fibroid    history   Past Surgical History:  Procedure Laterality Date   COLONOSCOPY WITH PROPOFOL N/A 09/16/2019   Procedure: COLONOSCOPY WITH PROPOFOL;  Surgeon: Midge Minium, MD;  Location: [facility] ENDOSCOPY;  Service: Endoscopy;  Laterality: N/A;   DILATION AND CURETTAGE OF UTERUS  1988   UTERINE ARTERY EMBOLIZATION  2009   Patient Active Problem List   Diagnosis Date Noted   Encounter for screening colonoscopy    Polyp of transverse colon    Depression, recurrent (HCC) 05/02/2017   Family history of PMS (premenstrual syndrome) 05/17/2016   BMI 40.0-44.9, adult (HCC) 05/17/2016   Metatarsalgia 05/24/2011   Gait abnormality 05/24/2011   Hypertriglyceridemia 01/18/2011   Morbid obesity (HCC) 01/17/2011   Depression 01/17/2011   Anemia    Uterine fibroid    History of traumatic brain injury    Seizures (HCC)    Left foot pain 11/11/2010    ONSET DATE: 2/24  REFERRING DIAG: RA of multiple joints   THERAPY DIAG:  Pain in right hand  Stiffness of left hand, not elsewhere classified  Trigger thumb of right hand  Pain in left hand  Stiffness of right hand, not elsewhere classified  Muscle weakness (generalized)  Rationale for Evaluation and  Treatment: Rehabilitation  SUBJECTIVE:   SUBJECTIVE STATEMENT: My thumbs are hurting today more but  my back , my hips and legs  - they usually hurts more when under stress - R thumb not as tight since using the bandaid Pt accompanied by: self  PERTINENT HISTORY: Last appt with Defoor PA - 11/02/22 - Pt Plaquenil since 09/07/22 - but cont to have some flare ups- thumb pain , increase swelling and and morning stiffness - Pt now on light dose of Methotrexate - 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 Pain coming in R thumb 5/10- tenderness over A1pulley  FALLS: Has patient fallen in last 6 months? Yes. 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  Has cats at home likes to read on her tablet, cleaning around the house on her phone about 2 hours a day.  PATIENT GOALS: Want the pain  in my hands better and  the stiffness - keep my motion and strength in my hands to do  the things I love and to be independent   NEXT MD VISIT: End of Oct  OBJECTIVE:  Note: Objective measures were completed at Evaluation unless otherwise noted.  HAND DOMINANCE: Right    UPPER EXTREMITY ROM:     Active ROM Right eval Left eval  Shoulder flexion    Shoulder abduction    Shoulder adduction    Shoulder extension    Shoulder internal rotation    Shoulder external rotation    Elbow flexion    Elbow extension    Wrist flexion 70 70  Wrist extension 80 80  Wrist ulnar deviation 30 30  Wrist radial deviation 20 1/10 pain  20  Wrist pronation    Wrist supination    (Blank rows = not tested)  Active ROM Right eval Left eval R 11/27/22  Thumb MCP (0-60)     Thumb IP (0-80) -20 ext - locking - pain   0 ext  Thumb  Radial abd/add (0-55) 50 60 60  Thumb Palmar abd/add (0-45) 70 60 70  Thumb Opposition to Small Finger Opposition to base of 5th - pain      Index MCP (0-90) 80  85   Index PIP (0-100) 95 95    Index DIP (0-70)       Long MCP (0-90) 80   90   Long PIP (0-100) 100  100   Long DIP (0-70)       Ring MCP (0-90) 80  90    Ring PIP (0-100) 100  100    Ring DIP (0-70)       Little MCP (0-90) 80  90    Little PIP (0-100)  100 95    Little DIP (0-70)       (Blank rows = not tested)   UPPER EXTREMITY MMT:  Strength in wrist in all planes - 4+/5 - no pain     HAND FUNCTION: EVAL : Grip strength: Right: 22 lbs; Left: 55 lbs, Lateral pinch: Right: 8 pain 5/10 lbs, Left: 11 lbs, and 3 point pinch: Right: 11 lbs, Left: 11 lbs  12/05/22: Grip strength: Right: 50 lbs pain ; Left: 55 lbs, Lateral pinch: Right: 8 lbs pain 3/10,  Left: 11 lbs, and 3 point pinch: Right: 8 lbs, Left: 11 lbs   SENSATION: Pt report CT symptoms- numbness but splints helps at night time- but when gripping object , pinching can go numb   EDEMA:   R thumb and MC's at R hand   COGNITION: Overall cognitive status: Within functional limits for tasks assessed       TODAY'S TREATMENT:                                                                                                                              DATE: 11/05 /24 Contrast  to bilateral hands - decrease edema , pain in bilateral thumb - L thumb CMC , MC and webspace prior to ROM  and soft tissue     R thumb  thumb PA and RA  WNL - pain free with ADD inbetween- cont with  tendon glides. L thumb PA and RA increase tenderness this date with soft tissue and ROM  12 reps 2 times a day after modalities at home pain-free Upon assessment - pt reading more in evening and holding book with thumbs - review modifications  Opposition to all digits with the left hand but hold off on the right because of thumb still tender at A1pulley  Done light PROM to thumb IP flexion ,  then MC - and composite - stop prior to tightness, pain and triggering   Ice massage several times a day right thumb A1 pulley Pt show this date increase extention of  R thumb IP in relax position while talking  Pt to cont with  double bandaid to maintain IP extention during day and night time  on R 5 th  session of Ionto done this date - pt was out of town last week  Ionto with dexamethazone for R thumb A1pulley - 19 min - med patch at 2.0 current - tolerate well and skin check done prior pt to keep patch on for hour afterwards   PATIENT EDUCATION: Education details: findings of eval and HEP -joint protection and modifications Person educated: Patient Education method: Explanation, Demonstration, Tactile cues, Verbal cues, and Handouts Education comprehension: verbalized understanding, returned demonstration, verbal cues required, and needs further education    GOALS: Goals reviewed with patient? Yes  SHORT TERM GOALS: Target date: 2 wks  Pain and tenderness decreased in right thumb to less than a 5/10 patient able to maintain thumb IP extension and resting without splint. Baseline: Patient unable to maintain IP extension of right thumb.  locking at -20 degrees-tenderness over thumb A1 pulley 5-8/10 as well as with passive extension of thumb IP Goal status: INITIAL    LONG TERM GOALS: Target date: 8 wk  Patient to be independent in home program to maintain digits active range of bilateral hands within normal limits and symptom-free including left thumb Baseline: Patient with increased stiffness and tightness over right metacarpals and decrease motion, left hand stiffness and tightness with pain can increase at the most to 8/10 in the right hand and 5/10 in the left hand Goal status: INITIAL  2.  Right thumb active range of motion increased to within functional limits and basic ADLs with triggering less than 1 time a day  baseline: Right thumb locks to -20 degrees extension at  rest ;unable to maintain full extension.  Tenderness over the A1 pulley 5/10 Goal status: INITIAL  3.  Right grip strength increased to more than 60% compared to the left for patient to be able to grip utensils, brush and toothbrush without increase symptoms Baseline: Grip on the right dominant hand 22 pounds and left 55 pounds-patient report difficulty gripping objects Goal status: INITIAL  4.  Patient to verbalize 3 joint protection and modifications as well as adaptive equipment to decrease pain in bilateral hands as well as triggering. 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  Patient present at OT evaluation with increased pain in bilateral hands with the right worse than the left.  5-8/10 pain with functional use.  Patient has a history of bilateral carpal tunnel symptoms.  Patient reports she sleeps with her wrist braces and has numbness with sustained grip during the day with certain activities.  Patient with increased stiffness and tightness in bilateral hands over MCPs and PIPs with composite fisting.  Pt arrive after being out of town last week - increase pain in thumb with L worse than R - still tenderness over A1 pulley  5/10 and but increase PROM of R thumb IP and MC flexion - pain free by OT - pt to cont with bandaid to R thumb - and review with pt to no hold book at night with thumbs - decrease pain in session and increase ROM in L thumb after contrast - 5th  session Ionto  done this date - tolerate well.  Pt show increase R thumb  IP extention  after doing bandaid last week.   Active range of motion within functional limits with stiffness more than pain in L hand  and bilateral wrist.  Patient with decreased grip and prehension strength on the right dominant hand.  Patient limited in performing ADLs and IADLs involving gripping, pinching, lifting, pulling and carrying.  PERFORMANCE DEFICITS: in functional skills including ADLs, IADLs, coordination, dexterity, sensation, ROM, strength, pain, flexibility, Fine motor control, decreased knowledge of use of DME, and UE functional use, , and psychosocial skills including environmental adaptation and routines and behaviors.   IMPAIRMENTS: are limiting patient from ADLs, IADLs, rest and sleep, play, leisure, and social participation.   COMORBIDITIES: may have co-morbidities  that affects occupational performance. Patient will benefit from skilled OT to address above impairments and improve overall function.  MODIFICATION OR ASSISTANCE TO COMPLETE EVALUATION: Min-Moderate modification of tasks or assist with assess necessary to complete an evaluation.  OT OCCUPATIONAL PROFILE AND HISTORY: Detailed assessment: Review of records and additional review of physical, cognitive, psychosocial history related to current functional performance.  CLINICAL DECISION MAKING: Moderate - several treatment options, min-mod task modification necessary  REHAB POTENTIAL: Good for digits- fair for R trigger thumb  EVALUATION COMPLEXITY: Moderate      PLAN:  OT FREQUENCY: 2x/week  OT DURATION: 8 weeks  PLANNED INTERVENTIONS: self care/ADL training, therapeutic exercise, manual therapy, passive range of motion, splinting, ultrasound, iontophoresis, paraffin, fluidotherapy, moist heat, cryotherapy, contrast bath, patient/family education, coping strategies training, and DME and/or AE instructions     CONSULTED AND AGREED WITH PLAN OF CARE: Patient     Oletta Cohn, OTR/L,CLT 12/19/2022, 5:02 PM

## 2022-12-21 ENCOUNTER — Ambulatory Visit: Payer: BC Managed Care – PPO | Admitting: Occupational Therapy

## 2022-12-21 ENCOUNTER — Encounter: Payer: BC Managed Care – PPO | Admitting: Occupational Therapy

## 2022-12-21 DIAGNOSIS — M65311 Trigger thumb, right thumb: Secondary | ICD-10-CM

## 2022-12-21 DIAGNOSIS — M79641 Pain in right hand: Secondary | ICD-10-CM | POA: Diagnosis not present

## 2022-12-21 DIAGNOSIS — M25641 Stiffness of right hand, not elsewhere classified: Secondary | ICD-10-CM

## 2022-12-21 DIAGNOSIS — M79642 Pain in left hand: Secondary | ICD-10-CM

## 2022-12-21 DIAGNOSIS — M25642 Stiffness of left hand, not elsewhere classified: Secondary | ICD-10-CM

## 2022-12-21 DIAGNOSIS — M6281 Muscle weakness (generalized): Secondary | ICD-10-CM

## 2022-12-21 NOTE — Therapy (Addendum)
OUTPATIENT OCCUPATIONAL THERAPY ORTHO TREATMENT  Patient Name: Valerie Underwood MRN: [id number] DOB:31-Jan-1971, 52 y.o., female Today's Date: 12/21/2022  PCP: Merlinda Frederick NP REFERRING PROVIDER: Defoor PA  END OF SESSION:  OT End of Session - 12/21/22 1507     Visit Number 7    Number of Visits 10    Date for OT Re-Evaluation 01/11/23    OT Start Time 1245    OT Stop Time 1330    OT Time Calculation (min) 45 min    Activity Tolerance Patient tolerated treatment well    Behavior During Therapy WFL for tasks assessed/performed             Past Medical History:  Diagnosis Date   Anemia    Depression    Eczema    History of traumatic brain injury 1997   Seizures (HCC)    hx- after TBI, last sz 2001   Uterine fibroid    history   Past Surgical History:  Procedure Laterality Date   COLONOSCOPY WITH PROPOFOL N/A 09/16/2019   Procedure: COLONOSCOPY WITH PROPOFOL;  Surgeon: Midge Minium, MD;  Location: [facility] ENDOSCOPY;  Service: Endoscopy;  Laterality: N/A;   DILATION AND CURETTAGE OF UTERUS  1988   UTERINE ARTERY EMBOLIZATION  2009   Patient Active Problem List   Diagnosis Date Noted   Encounter for screening colonoscopy    Polyp of transverse colon    Depression, recurrent (HCC) 05/02/2017   Family history of PMS (premenstrual syndrome) 05/17/2016   BMI 40.0-44.9, adult (HCC) 05/17/2016   Metatarsalgia 05/24/2011   Gait abnormality 05/24/2011   Hypertriglyceridemia 01/18/2011   Morbid obesity (HCC) 01/17/2011   Depression 01/17/2011   Anemia    Uterine fibroid    History of traumatic brain injury    Seizures (HCC)    Left foot pain 11/11/2010    ONSET DATE: 2/24  REFERRING DIAG: RA of multiple joints   THERAPY DIAG:  Pain in right hand  Stiffness of left hand, not elsewhere classified  Trigger thumb of right hand  Pain in left hand  Stiffness of right hand, not elsewhere classified  Muscle weakness (generalized)  Rationale for Evaluation and  Treatment: Rehabilitation  SUBJECTIVE:   SUBJECTIVE STATEMENT: My hands feels better.  My thumbs also.  I did not hold my book when reading last night.  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  R thumb 5/10- tenderness over A1pulley  FALLS: Has patient fallen in last 6 months? Yes. 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  Has cats at home likes to read on her tablet, cleaning around the house on her phone about 2 hours a day.  PATIENT GOALS: Want the pain  in my hands better and  the stiffness - keep my motion and strength in my hands  to do the things I love and to be independent   NEXT MD VISIT: End of Oct  OBJECTIVE:  Note: Objective measures were completed at Evaluation unless otherwise noted.  HAND DOMINANCE: Right    UPPER EXTREMITY ROM:     Active ROM Right eval Left eval  Shoulder flexion    Shoulder abduction    Shoulder adduction    Shoulder extension    Shoulder internal rotation    Shoulder external rotation    Elbow flexion    Elbow extension    Wrist flexion 70 70  Wrist extension 80 80  Wrist ulnar deviation 30 30  Wrist radial deviation 20 1/10 pain  20  Wrist pronation    Wrist supination    (Blank rows = not tested)  Active ROM Right eval Left eval R 11/27/22 R 12/21/22  Thumb MCP (0-60)    45 PROM  Thumb IP (0-80) -20 ext - locking  - pain   0 ext 45 PROM - pain free and trigger free   Thumb Radial abd/add (0-55) 50 60 60 60  Thumb Palmar abd/add (0-45) 70 60 70 70  Thumb Opposition to Small Finger Opposition to base of 5th - pain       Index MCP (0-90) 80  85    Index PIP (0-100) 95 95     Index DIP (0-70)        Long MCP (0-90) 80   90    Long PIP (0-100) 100  100    Long DIP (0-70)        Ring MCP (0-90) 80  90     Ring PIP (0-100) 100  100     Ring DIP (0-70)        Little MCP (0-90) 80  90     Little PIP (0-100)  100 95     Little DIP (0-70)        (Blank rows = not tested)   UPPER EXTREMITY MMT:  Strength in wrist in all planes - 4+/5 - no pain     HAND FUNCTION: EVAL : Grip strength: Right: 22 lbs; Left: 55 lbs, Lateral pinch: Right: 8 pain 5/10 lbs, Left: 11 lbs, and 3 point pinch: Right: 11 lbs, Left: 11 lbs  12/05/22: Grip strength: Right: 50 lbs pain ; Left: 55 lbs, Lateral pinch: Right: 8 lbs pain 3/10,  Left: 11 lbs, and 3 point pinch: Right: 8 lbs, Left: 11 lbs   SENSATION: Pt report CT symptoms- numbness but splints helps at night time- but when gripping object , pinching can go numb   EDEMA:   R thumb and MC's at R hand   COGNITION: Overall cognitive status: Within functional limits for tasks assessed       TODAY'S TREATMENT:  DATE: 11/07 /24 Patient arrives with decreased pain in bilateral thumbs. Modified reading her book in the evenings. Propping it up not holding.  Contrast to right hand- decrease edema , pain in  thumb -  L thumb CMC , MC and webspace prior to ROM and soft tissue     R thumb  thumb PA and RA  WNL - pain free with ADD inbetween- cont with  tendon glides.  Could tolerate and showed great passive range of motion to right thumb IP and MC flexion pain-free to about 45 degrees. Patient to hold off on passive range of motion at home because  of tenderness over A1 pulley still 5/10  Ice massage several times a day right thumb A1 pulley Pt show increase extention of  R thumb IP in relax position while talking  Pt to cont with  double bandaid to maintain IP extention during day and night time  on R 6 th  session of Ionto done this date - pt was out of town last week  Ionto with dexamethazone for R thumb A1pulley - 19 min - med patch at 2.0 current - tolerate well and skin check done prior pt to keep patch on for hour afterwards   PATIENT EDUCATION: Education details: findings of eval and HEP -joint protection and modifications Person educated: Patient Education method: Explanation, Demonstration, Tactile cues, Verbal cues, and Handouts Education comprehension: verbalized understanding, returned demonstration, verbal cues required, and needs further education    GOALS: Goals reviewed with patient? Yes  SHORT TERM GOALS: Target date: 2 wks  Pain and tenderness decreased in right thumb to less than a 5/10 patient able to maintain thumb IP extension and resting without splint. Baseline: Patient unable to maintain IP extension of right thumb.  locking at -20 degrees-tenderness over thumb A1 pulley 5-8/10 as well as with passive extension of thumb IP Goal status: INITIAL    LONG TERM GOALS: Target date: 8 wk  Patient to be independent in home program to maintain digits active range of bilateral hands within normal limits and symptom-free including left thumb Baseline: Patient with increased stiffness and tightness over right metacarpals and decrease motion, left hand stiffness and tightness with pain can increase at the most to 8/10 in the right hand and 5/10 in the left hand Goal status: INITIAL  2.  Right thumb active range of motion increased to within functional limits and basic ADLs with triggering less than 1 time a day  baseline: Right thumb locks to -20 degrees extension at rest ;unable to maintain full extension.   Tenderness over the A1 pulley 5/10 Goal status: INITIAL  3.  Right grip strength increased to more than 60% compared to the left for patient to be able to grip utensils, brush and toothbrush without increase symptoms Baseline: Grip on the right dominant hand 22 pounds and left 55 pounds-patient report difficulty gripping objects Goal status: INITIAL  4.  Patient to verbalize 3 joint protection and modifications as well as adaptive equipment to decrease pain in bilateral hands as well as triggering. 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  Patient present at OT evaluation with increased pain in bilateral hands with the right worse than the left.  5-8/10  pain with functional use.  Patient has a history of bilateral carpal tunnel symptoms.  Patient reports she sleeps with her wrist braces and has numbness with sustained grip during the day with certain activities.  Patient with increased stiffness and tightness in bilateral hands over MCPs and PIPs with composite fisting at eval  -patient showed decreased pain in bilateral thumbs since last session.  Continues to have tenderness over A1 pulley  5/10 and but increase PROM of R thumb IP and MC flexion to about 45 degrees pain-free - pt to cont with bandaid to R thumb -6th  session Ionto  done this date - tolerate well.  Pt show increase R thumb  IP extention  after doing bandaid last week.   Active range of motion within functional limits with stiffness more than pain in L hand and bilateral wrist.  Patient with decreased grip and prehension strength on the right dominant hand.  Patient limited in performing ADLs and IADLs involving  gripping, pinching, lifting, pulling and carrying.  PERFORMANCE DEFICITS: in functional skills including ADLs, IADLs, coordination, dexterity, sensation, ROM, strength, pain, flexibility, Fine motor control, decreased knowledge of use of DME, and UE functional use, , and psychosocial skills including environmental adaptation and routines and behaviors.   IMPAIRMENTS: are limiting patient from ADLs, IADLs, rest and sleep, play, leisure, and social participation.   COMORBIDITIES: may have co-morbidities  that affects occupational performance. Patient will benefit from skilled OT to address above impairments and improve overall function.  MODIFICATION OR ASSISTANCE TO COMPLETE EVALUATION: Min-Moderate modification of tasks or assist with assess necessary to complete an evaluation.  OT OCCUPATIONAL PROFILE AND HISTORY: Detailed assessment: Review of records and additional review of physical, cognitive, psychosocial history related to current functional performance.  CLINICAL DECISION MAKING: Moderate - several treatment options, min-mod task modification necessary  REHAB POTENTIAL: Good for digits- fair for R trigger thumb  EVALUATION COMPLEXITY: Moderate      PLAN:  OT FREQUENCY: 2x/week  OT DURATION: 8 weeks  PLANNED INTERVENTIONS: self care/ADL training, therapeutic exercise, manual therapy, passive range of motion, splinting, ultrasound, iontophoresis, paraffin, fluidotherapy, moist heat, cryotherapy, contrast bath, patient/family education, coping strategies training, and DME and/or AE instructions     CONSULTED AND AGREED WITH PLAN OF CARE: Patient     Oletta Cohn, OTR/L,CLT 12/21/2022, 3:16 PM

## 2022-12-25 ENCOUNTER — Ambulatory Visit: Payer: BC Managed Care – PPO | Admitting: Occupational Therapy

## 2022-12-25 DIAGNOSIS — M79642 Pain in left hand: Secondary | ICD-10-CM

## 2022-12-25 DIAGNOSIS — M65311 Trigger thumb, right thumb: Secondary | ICD-10-CM

## 2022-12-25 DIAGNOSIS — M25641 Stiffness of right hand, not elsewhere classified: Secondary | ICD-10-CM

## 2022-12-25 DIAGNOSIS — M79641 Pain in right hand: Secondary | ICD-10-CM | POA: Diagnosis not present

## 2022-12-25 DIAGNOSIS — M25642 Stiffness of left hand, not elsewhere classified: Secondary | ICD-10-CM

## 2022-12-25 DIAGNOSIS — M6281 Muscle weakness (generalized): Secondary | ICD-10-CM

## 2022-12-25 NOTE — Therapy (Signed)
OUTPATIENT OCCUPATIONAL THERAPY ORTHO TREATMENT  Patient Name: Valerie Underwood MRN: [id number] DOB:11/14/1970, 52 y.o., female Today's Date: 12/25/2022  PCP: Merlinda Frederick NP REFERRING PROVIDER: Defoor PA  END OF SESSION:  OT End of Session - 12/25/22 1440     Visit Number 8    Number of Visits 10    Date for OT Re-Evaluation 01/11/23    OT Start Time 1206    OT Stop Time 1255    OT Time Calculation (min) 49 min    Activity Tolerance Patient tolerated treatment well    Behavior During Therapy WFL for tasks assessed/performed             Past Medical History:  Diagnosis Date   Anemia    Depression    Eczema    History of traumatic brain injury 1997   Seizures (HCC)    hx- after TBI, last sz 2001   Uterine fibroid    history   Past Surgical History:  Procedure Laterality Date   COLONOSCOPY WITH PROPOFOL N/A 09/16/2019   Procedure: COLONOSCOPY WITH PROPOFOL;  Surgeon: Midge Minium, MD;  Location: [facility] ENDOSCOPY;  Service: Endoscopy;  Laterality: N/A;   DILATION AND CURETTAGE OF UTERUS  1988   UTERINE ARTERY EMBOLIZATION  2009   Patient Active Problem List   Diagnosis Date Noted   Encounter for screening colonoscopy    Polyp of transverse colon    Depression, recurrent (HCC) 05/02/2017   Family history of PMS (premenstrual syndrome) 05/17/2016   BMI 40.0-44.9, adult (HCC) 05/17/2016   Metatarsalgia 05/24/2011   Gait abnormality 05/24/2011   Hypertriglyceridemia 01/18/2011   Morbid obesity (HCC) 01/17/2011   Depression 01/17/2011   Anemia    Uterine fibroid    History of traumatic brain injury    Seizures (HCC)    Left foot pain 11/11/2010    ONSET DATE: 2/24  REFERRING DIAG: RA of multiple joints   THERAPY DIAG:  Pain in right hand  Stiffness of left hand, not elsewhere classified  Trigger thumb of right hand  Pain in left hand  Stiffness of right hand, not elsewhere classified  Muscle weakness (generalized)  Rationale for Evaluation and  Treatment: Rehabilitation  SUBJECTIVE:   SUBJECTIVE STATEMENT: Feels much better my hands and thumbs.  I pay attention however left in grip and pull things.  Wearing the soft plaque splints.  The Band-Aid on my right thumb.  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  R thumb 2/10- tenderness over A1pulley  FALLS: Has patient fallen in last 6 months? Yes. 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  Has cats at home likes to read on her tablet, cleaning around the house on her phone about 2 hours a day.  PATIENT GOALS: Want the pain  in my hands better and  the stiffness - keep my motion and strength in my hands to do  the things I love and to be independent   NEXT MD VISIT: End of Oct  OBJECTIVE:  Note: Objective measures were completed at Evaluation unless otherwise noted.  HAND DOMINANCE: Right    UPPER EXTREMITY ROM:     Active ROM Right eval Left eval  Shoulder flexion    Shoulder abduction    Shoulder adduction    Shoulder extension    Shoulder internal rotation    Shoulder external rotation    Elbow flexion    Elbow extension    Wrist flexion 70 70  Wrist extension 80 80  Wrist ulnar deviation 30 30  Wrist radial deviation 20 1/10 pain  20  Wrist pronation    Wrist supination    (Blank rows = not tested)  Active ROM Right eval Left eval R 11/27/22 R 12/21/22 R 12/25/22  Thumb MCP (0-60)    45 PROM PROM 60   Thumb IP (0-80) -20 ext - locking - pain   0 ext 45 PROM - pain free and trigger free  PROM 50  Trigger attempt to do AROM   Thumb Radial abd/add (0-55) 50 60 60 60   Thumb Palmar abd/add (0-45) 70 60 70 70   Thumb Opposition to Small Finger Opposition to base of 5th - pain        Index MCP (0-90) 80  85     Index PIP (0-100) 95 95      Index DIP (0-70)         Long MCP (0-90) 80   90     Long PIP (0-100) 100  100     Long DIP (0-70)         Ring MCP (0-90) 80  90      Ring PIP (0-100) 100  100      Ring DIP (0-70)         Little MCP (0-90) 80  90      Little PIP (0-100)  100 95      Little DIP (0-70)         (Blank rows = not tested)   UPPER EXTREMITY MMT:  Strength in wrist in all planes - 4+/5 - no pain     HAND FUNCTION: EVAL : Grip strength: Right: 22 lbs; Left: 55 lbs, Lateral pinch: Right: 8 pain 5/10 lbs, Left: 11 lbs, and 3 point pinch: Right: 11 lbs, Left: 11 lbs  12/05/22: Grip strength: Right: 50 lbs pain ; Left: 55 lbs, Lateral pinch: Right: 8 lbs pain 3/10,  Left: 11 lbs, and 3 point pinch: Right: 8 lbs, Left: 11 lbs   SENSATION: Pt report CT symptoms- numbness but splints helps at night time- but when gripping object , pinching can go numb   EDEMA:   R thumb and MC's at R hand   COGNITION: Overall cognitive status: Within functional limits for tasks assessed       TODAY'S TREATMENT:  DATE: 11/11 /24 Patient arrives with decreased pain in bilateral thumbs. Modified reading her book in the evenings. Propping it up not holding.  Contrast to right hand- decrease edema , pain in  thumb - decrease to 2/10 at tenderness at A1pulley     R thumb  thumb PA and RA  WNL - pain free with ADD inbetween- cont with  tendon glides.  Could tolerate and showed great passive range of motion to right thumb IP and MC flexion pain-free to about 50  degrees at IP and 60 degrees at the [facility] With attempts of opposition or any active range of motion patient showed some triggering. Patient to hold off on passive range of motion at home because of tenderness over A1 pulley still  2/10  Ice massage several times a day right thumb A1 pulley Pt show increase extention of  R thumb IP in relax position while talking  Pt to cont with  double bandaid to maintain IP extention during day and night time  on R 7 th  session of Ionto done this date - pt was out of town 2 wks ago Ionto with dexamethazone for R thumb A1pulley - 19 min - med patch at 2.0 current - tolerate well and skin check done prior pt to keep patch on for hour afterwards   PATIENT EDUCATION: Education details: findings of eval and HEP -joint protection and modifications Person educated: Patient Education method: Explanation, Demonstration, Tactile cues, Verbal cues, and Handouts Education comprehension: verbalized understanding, returned demonstration, verbal cues required, and needs further education    GOALS: Goals reviewed with patient? Yes  SHORT TERM GOALS: Target date: 2 wks  Pain and tenderness decreased in right thumb to less than a 5/10 patient able to maintain thumb IP extension and resting without splint. Baseline: Patient unable to maintain IP extension of right thumb.  locking at -20 degrees-tenderness over thumb A1 pulley 5-8/10 as well as with passive extension of thumb IP Goal status: INITIAL    LONG TERM GOALS: Target date: 8 wk  Patient to be independent in home program to maintain digits active range of bilateral hands within normal limits and symptom-free including left thumb Baseline: Patient with increased stiffness and tightness over right metacarpals and decrease motion, left hand stiffness and tightness with pain can increase at the most to 8/10 in the right hand and 5/10 in the left hand Goal status: INITIAL  2.  Right thumb active range of motion  increased to within functional limits and basic ADLs with triggering less than 1 time a day  baseline: Right thumb locks to -20 degrees extension at rest ;unable to maintain full extension.  Tenderness over the A1 pulley 5/10 Goal status: INITIAL  3.  Right grip strength increased to more than 60% compared to the left for patient to be able to grip utensils, brush and toothbrush without increase symptoms Baseline: Grip on the right dominant hand 22 pounds and left 55 pounds-patient report difficulty gripping objects Goal status: INITIAL  4.  Patient to verbalize 3 joint protection and modifications as well as adaptive equipment to decrease pain in bilateral hands as well as triggering. 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  Patient present at OT  evaluation with increased pain in bilateral hands with the right worse than the left.  5-8/10 pain with functional use.  Patient has a history of bilateral carpal tunnel symptoms.  Patient reports she sleeps with her wrist braces and has numbness  at night or in am.  Patient with increased stiffness and tightness in bilateral hands over MCPs and PIPs with composite fisting at eval  -patient showed decreased pain in bilateral thumbs since last session.  Continues to have tenderness over A1 pulley  decrease to 2/10 and increase PROM of R thumb IP and MC flexion to about  50-60 degrees pain-free - pt to cont with bandaid to R thumb -7th  session Ionto  done this date - tolerate well.  Pt show increase R thumb  IP extention  after doing bandaid last 2 wks   Active range of motion within functional limits with stiffness more than pain in L  hand and bilateral wrist.  Patient with decreased grip and prehension strength on the right dominant hand.  Patient limited in performing ADLs and IADLs involving gripping, pinching, lifting, pulling and carrying.  PERFORMANCE DEFICITS: in functional skills including ADLs, IADLs, coordination, dexterity, sensation, ROM, strength, pain, flexibility, Fine motor control, decreased knowledge of use of DME, and UE functional use, , and psychosocial skills including environmental adaptation and routines and behaviors.   IMPAIRMENTS: are limiting patient from ADLs, IADLs, rest and sleep, play, leisure, and social participation.   COMORBIDITIES: may have co-morbidities  that affects occupational performance. Patient will benefit from skilled OT to address above impairments and improve overall function.  MODIFICATION OR ASSISTANCE TO COMPLETE EVALUATION: Min-Moderate modification of tasks or assist with assess necessary to complete an evaluation.  OT OCCUPATIONAL PROFILE AND HISTORY: Detailed assessment: Review of records and additional review of physical, cognitive, psychosocial history related to current functional performance.  CLINICAL DECISION MAKING: Moderate - several treatment options, min-mod task modification necessary  REHAB POTENTIAL: Good for digits- fair for R trigger thumb  EVALUATION COMPLEXITY: Moderate      PLAN:  OT FREQUENCY: 2x/week  OT DURATION: 8 weeks  PLANNED INTERVENTIONS: self care/ADL training, therapeutic exercise, manual therapy, passive range of motion, splinting, ultrasound, iontophoresis, paraffin, fluidotherapy, moist heat, cryotherapy, contrast bath, patient/family education, coping strategies training, and DME and/or AE instructions     CONSULTED AND AGREED WITH PLAN OF CARE: Patient     Oletta Cohn, OTR/L,CLT 12/25/2022, 2:42 PM

## 2022-12-28 ENCOUNTER — Ambulatory Visit: Payer: BC Managed Care – PPO | Admitting: Occupational Therapy

## 2022-12-28 DIAGNOSIS — M25642 Stiffness of left hand, not elsewhere classified: Secondary | ICD-10-CM

## 2022-12-28 DIAGNOSIS — M79642 Pain in left hand: Secondary | ICD-10-CM

## 2022-12-28 DIAGNOSIS — M6281 Muscle weakness (generalized): Secondary | ICD-10-CM

## 2022-12-28 DIAGNOSIS — M65311 Trigger thumb, right thumb: Secondary | ICD-10-CM

## 2022-12-28 DIAGNOSIS — M79641 Pain in right hand: Secondary | ICD-10-CM | POA: Diagnosis not present

## 2022-12-28 DIAGNOSIS — M25641 Stiffness of right hand, not elsewhere classified: Secondary | ICD-10-CM

## 2022-12-28 NOTE — Therapy (Signed)
OUTPATIENT OCCUPATIONAL THERAPY ORTHO TREATMENT  Patient Name: Valerie Underwood MRN: [id number] DOB:1970/04/21, 52 y.o., female Today's Date: 12/28/2022  PCP: Merlinda Frederick NP REFERRING PROVIDER: Defoor PA  END OF SESSION:  OT End of Session - 12/28/22 1809     Visit Number 9    Number of Visits 10    Date for OT Re-Evaluation 01/11/23    OT Start Time 1335    OT Stop Time 1418    OT Time Calculation (min) 43 min    Activity Tolerance Patient tolerated treatment well    Behavior During Therapy WFL for tasks assessed/performed             Past Medical History:  Diagnosis Date   Anemia    Depression    Eczema    History of traumatic brain injury 1997   Seizures (HCC)    hx- after TBI, last sz 2001   Uterine fibroid    history   Past Surgical History:  Procedure Laterality Date   COLONOSCOPY WITH PROPOFOL N/A 09/16/2019   Procedure: COLONOSCOPY WITH PROPOFOL;  Surgeon: Midge Minium, MD;  Location: [facility] ENDOSCOPY;  Service: Endoscopy;  Laterality: N/A;   DILATION AND CURETTAGE OF UTERUS  1988   UTERINE ARTERY EMBOLIZATION  2009   Patient Active Problem List   Diagnosis Date Noted   Encounter for screening colonoscopy    Polyp of transverse colon    Depression, recurrent (HCC) 05/02/2017   Family history of PMS (premenstrual syndrome) 05/17/2016   BMI 40.0-44.9, adult (HCC) 05/17/2016   Metatarsalgia 05/24/2011   Gait abnormality 05/24/2011   Hypertriglyceridemia 01/18/2011   Morbid obesity (HCC) 01/17/2011   Depression 01/17/2011   Anemia    Uterine fibroid    History of traumatic brain injury    Seizures (HCC)    Left foot pain 11/11/2010    ONSET DATE: 2/24  REFERRING DIAG: RA of multiple joints   THERAPY DIAG:  Pain in right hand  Stiffness of left hand, not elsewhere classified  Trigger thumb of right hand  Pain in left hand  Stiffness of right hand, not elsewhere classified  Muscle weakness (generalized)  Rationale for Evaluation and  Treatment: Rehabilitation  SUBJECTIVE:   SUBJECTIVE STATEMENT: My back and fingers are just hurting today with this rainy and cold day- fingers in both hands just stiff  pt accompanied by: self  PERTINENT HISTORY: Last appt with Defoor PA - 11/02/22 - Pt Plaquenil since 09/07/22 - but cont to have some flare ups- thumb pain , increase swelling and and morning stiffness - Pt now on light dose of Methotrexate - 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  R thumb 2/10- tenderness over A1pulley -digits sore 3/10  FALLS: Has patient fallen in last 6 months? Yes. 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  Has cats at home likes to read on her tablet, cleaning around the house on her phone about 2 hours a day.  PATIENT GOALS: Want the pain  in my hands better and  the stiffness - keep my motion and strength in my hands to do the things I love and to be independent   NEXT MD  VISIT: End of Oct  OBJECTIVE:  Note: Objective measures were completed at Evaluation unless otherwise noted.  HAND DOMINANCE: Right    UPPER EXTREMITY ROM:     Active ROM Right eval Left eval  Shoulder flexion    Shoulder abduction    Shoulder adduction    Shoulder extension    Shoulder internal rotation    Shoulder external rotation    Elbow flexion    Elbow extension    Wrist flexion 70 70  Wrist extension 80 80  Wrist ulnar deviation 30 30  Wrist radial deviation 20 1/10 pain  20  Wrist pronation    Wrist supination    (Blank rows = not tested)  Active ROM Right eval Left eval R 11/27/22 R 12/21/22 R 12/25/22 R 12/28/22  Thumb MCP (0-60)    45 PROM PROM 60 PROM 60  Thumb IP (0-80) -20 ext - locking - pain   0  ext 45 PROM - pain free and trigger free  PROM 50  Trigger attempt to do AROM  PROM 80 , block AROM 30 no triggering  Thumb Radial abd/add (0-55) 50 60 60 60  60  Thumb Palmar abd/add (0-45) 70 60 70 70  70  Thumb Opposition to Small Finger Opposition to base of 5th - pain         Index MCP (0-90) 80  85      Index PIP (0-100) 95 95       Index DIP (0-70)          Long MCP (0-90) 80   90      Long PIP (0-100) 100  100      Long DIP (0-70)          Ring MCP (0-90) 80  90       Ring PIP (0-100) 100  100       Ring DIP (0-70)          Little MCP (0-90) 80  90       Little PIP (0-100)  100 95       Little DIP (0-70)          (Blank rows = not tested)   UPPER EXTREMITY MMT:  Strength in wrist in all planes - 4+/5 - no pain     HAND FUNCTION: EVAL : Grip strength: Right: 22 lbs; Left: 55 lbs, Lateral pinch: Right: 8 pain 5/10 lbs, Left: 11 lbs, and 3 point pinch: Right: 11 lbs, Left: 11 lbs  12/05/22: Grip strength: Right: 50 lbs pain ; Left: 55 lbs, Lateral pinch: Right: 8 lbs pain 3/10,  Left: 11 lbs, and 3 point pinch: Right: 8 lbs, Left: 11 lbs   SENSATION: Pt report CT symptoms- numbness but splints helps at night time- but when gripping object , pinching can go numb   EDEMA:   R thumb and MC's at R hand   COGNITION: Overall cognitive status: Within functional limits for tasks assessed       TODAY'S TREATMENT:  DATE: 11/14 /24 Increase soreness in digits and stiffness Could tolerate and showed great passive range of motion to right thumb IP and MC flexion pain-free to about 50 degrees at IP and 60 degrees at the [facility] Able to do blocked AROM IP 30 with no triggering  Patient ed on doing pain free passive range of motion at home  at thumb IP and MC - composite pain free  After contrast  Paraffin this date to L hand and R digits to palm  NOT R thumb 8  min - decrease pain and stiffness - prior to tendon glides  Ed pt on paraffin use and home unit   Ice massage several times a day right thumb A1 pulley Pt show increase extention of  R thumb IP in relax position while talking  Pt to cont with  double bandaid to maintain IP extention during day and night time  on R 8 th  session of Ionto done this date - pt was out of town 2 wks ago earlier in tx Ionto with dexamethazone for R thumb A1pulley - 19 min - med patch at 2.0 current - tolerate well and skin check done prior pt to keep patch on for hour afterwards   PATIENT EDUCATION: Education details: findings of eval and HEP -joint protection and modifications Person educated: Patient Education method: Explanation, Demonstration, Tactile cues, Verbal cues, and Handouts Education comprehension: verbalized understanding, returned demonstration, verbal cues required, and needs further education    GOALS: Goals reviewed with patient? Yes  SHORT TERM GOALS: Target date: 2 wks  Pain and tenderness decreased in right thumb to less than a 5/10 patient able to maintain thumb IP extension and resting without splint. Baseline: Patient unable to maintain IP extension of right thumb.  locking at -20 degrees-tenderness over thumb A1 pulley 5-8/10 as well as with passive extension of thumb IP Goal status: INITIAL    LONG TERM GOALS: Target date: 8 wk  Patient to be independent in home program to maintain digits active range of bilateral hands within normal limits and symptom-free including left thumb Baseline: Patient with increased stiffness and tightness over right metacarpals and decrease motion, left hand stiffness and tightness with pain can increase at the most to 8/10 in the right hand and 5/10 in the left hand Goal status: INITIAL  2.  Right thumb active range of motion increased to within functional limits and basic ADLs with triggering less than 1 time a day  baseline: Right thumb locks to  -20 degrees extension at rest ;unable to maintain full extension.  Tenderness over the A1 pulley 5/10 Goal status: INITIAL  3.  Right grip strength increased to more than 60% compared to the left for patient to be able to grip utensils, brush and toothbrush without increase symptoms Baseline: Grip on the right dominant hand 22 pounds and left 55 pounds-patient report difficulty gripping objects Goal status: INITIAL  4.  Patient to verbalize 3 joint protection and modifications as well as adaptive equipment to decrease pain in bilateral hands as well as triggering. 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  Patient present at OT evaluation with increased pain in bilateral hands with the right worse than the left.  5-8/10  pain with functional use.  Patient has a history of bilateral carpal tunnel symptoms.  Patient reports she sleeps with her wrist braces and has numbness  at night or in am.  Patient with increased stiffness and tightness in bilateral hands over MCPs and PIPs with composite fisting  coming in today with cold and rainy day - done paraffin to R digits and L hand - with progress in pain and ROM - prior  pain free tendon glides - pt was ed on home unit - L thumb A1pulley tenderness 2/10 and but show increase PROM of R thumb IP and MC flexion to about  50-60 degrees pain-free - able to initiate blocked AROM IP to 30 - 8th  session Ionto  done this date - tolerate well.  Active range of motion within functional limits with stiffness more than pain in L hand and bilateral wrist.  Patient with decreased grip and prehension strength on the right dominant hand.  Patient  limited in performing ADLs and IADLs involving gripping, pinching, lifting, pulling and carrying.  PERFORMANCE DEFICITS: in functional skills including ADLs, IADLs, coordination, dexterity, sensation, ROM, strength, pain, flexibility, Fine motor control, decreased knowledge of use of DME, and UE functional use, , and psychosocial skills including environmental adaptation and routines and behaviors.   IMPAIRMENTS: are limiting patient from ADLs, IADLs, rest and sleep, play, leisure, and social participation.   COMORBIDITIES: may have co-morbidities  that affects occupational performance. Patient will benefit from skilled OT to address above impairments and improve overall function.  MODIFICATION OR ASSISTANCE TO COMPLETE EVALUATION: Min-Moderate modification of tasks or assist with assess necessary to complete an evaluation.  OT OCCUPATIONAL PROFILE AND HISTORY: Detailed assessment: Review of records and additional review of physical, cognitive, psychosocial history related to current functional performance.  CLINICAL DECISION MAKING: Moderate - several treatment options, min-mod task modification necessary  REHAB POTENTIAL: Good for digits- fair for R trigger thumb  EVALUATION COMPLEXITY: Moderate      PLAN:  OT FREQUENCY: 2x/week  OT DURATION: 8 weeks  PLANNED INTERVENTIONS: self care/ADL training, therapeutic exercise, manual therapy, passive range of motion, splinting, ultrasound, iontophoresis, paraffin, fluidotherapy, moist heat, cryotherapy, contrast bath, patient/family education, coping strategies training, and DME and/or AE instructions     CONSULTED AND AGREED WITH PLAN OF CARE: Patient     Oletta Cohn, OTR/L,CLT 12/28/2022, 6:12 PM

## 2023-01-01 ENCOUNTER — Ambulatory Visit: Payer: BC Managed Care – PPO | Admitting: Occupational Therapy

## 2023-01-01 DIAGNOSIS — M25641 Stiffness of right hand, not elsewhere classified: Secondary | ICD-10-CM

## 2023-01-01 DIAGNOSIS — M79642 Pain in left hand: Secondary | ICD-10-CM

## 2023-01-01 DIAGNOSIS — M25642 Stiffness of left hand, not elsewhere classified: Secondary | ICD-10-CM

## 2023-01-01 DIAGNOSIS — M79641 Pain in right hand: Secondary | ICD-10-CM | POA: Diagnosis not present

## 2023-01-01 DIAGNOSIS — M6281 Muscle weakness (generalized): Secondary | ICD-10-CM

## 2023-01-01 DIAGNOSIS — M65311 Trigger thumb, right thumb: Secondary | ICD-10-CM

## 2023-01-01 NOTE — Therapy (Signed)
OUTPATIENT OCCUPATIONAL THERAPY ORTHO TREATMENT/10th visit   Patient Name: Valerie Underwood MRN: [id number] DOB:21-Feb-1970, 51 y.o., female Today's Date: 01/01/2023  PCP: Merlinda Frederick NP REFERRING PROVIDER: Defoor PA  END OF SESSION:  OT End of Session - 01/01/23 1535     Visit Number 10    Number of Visits 17    Date for OT Re-Evaluation 01/29/23    OT Start Time 1434    OT Stop Time 1525    OT Time Calculation (min) 51 min    Activity Tolerance Patient tolerated treatment well    Behavior During Therapy WFL for tasks assessed/performed             Past Medical History:  Diagnosis Date   Anemia    Depression    Eczema    History of traumatic brain injury 1997   Seizures (HCC)    hx- after TBI, last sz 2001   Uterine fibroid    history   Past Surgical History:  Procedure Laterality Date   COLONOSCOPY WITH PROPOFOL N/A 09/16/2019   Procedure: COLONOSCOPY WITH PROPOFOL;  Surgeon: Midge Minium, MD;  Location: [facility] ENDOSCOPY;  Service: Endoscopy;  Laterality: N/A;   DILATION AND CURETTAGE OF UTERUS  1988   UTERINE ARTERY EMBOLIZATION  2009   Patient Active Problem List   Diagnosis Date Noted   Encounter for screening colonoscopy    Polyp of transverse colon    Depression, recurrent (HCC) 05/02/2017   Family history of PMS (premenstrual syndrome) 05/17/2016   BMI 40.0-44.9, adult (HCC) 05/17/2016   Metatarsalgia 05/24/2011   Gait abnormality 05/24/2011   Hypertriglyceridemia 01/18/2011   Morbid obesity (HCC) 01/17/2011   Depression 01/17/2011   Anemia    Uterine fibroid    History of traumatic brain injury    Seizures (HCC)    Left foot pain 11/11/2010    ONSET DATE: 2/24  REFERRING DIAG: RA of multiple joints   THERAPY DIAG:  Pain in right hand  Stiffness of left hand, not elsewhere classified  Trigger thumb of right hand  Pain in left hand  Stiffness of right hand, not elsewhere classified  Muscle weakness (generalized)  Rationale for Evaluation  and Treatment: Rehabilitation  SUBJECTIVE:   SUBJECTIVE STATEMENT: My back, hip and shoulders had been bothering me - took some Melocican  for 3 days- doing okay done the exercises to my R thumb  pt accompanied by: self  PERTINENT HISTORY: Last appt with Defoor PA - 11/02/22 - Pt Plaquenil since 09/07/22 - but cont to have some flare ups- thumb pain , increase swelling and and morning stiffness - Pt now on light dose of Methotrexate - 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  R thumb 4/10- tenderness over A1pulley  FALLS: Has patient fallen in last 6 months? Yes. 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  Has cats at home likes to read on her tablet, cleaning around the house on her phone about 2 hours a day.  PATIENT GOALS: Want the pain  in my hands better and  the stiffness - keep my motion and strength in my hands to do the things I love and to be  independent   NEXT MD VISIT: End of Oct  OBJECTIVE:  Note: Objective measures were completed at Evaluation unless otherwise noted.  HAND DOMINANCE: Right    UPPER EXTREMITY ROM:     Active ROM Right eval Left eval  Shoulder flexion    Shoulder abduction    Shoulder adduction    Shoulder extension    Shoulder internal rotation    Shoulder external rotation    Elbow flexion    Elbow extension    Wrist flexion 70 70  Wrist extension 80 80  Wrist ulnar deviation 30 30  Wrist radial deviation 20 1/10 pain  20  Wrist pronation    Wrist supination    (Blank rows = not tested)  Active ROM Right eval Left eval R 11/27/22 R 12/21/22 R 12/25/22 R 12/28/22 R 01/01/23  Thumb MCP (0-60)    45 PROM PROM 60 PROM 60 PROM 60  Thumb IP (0-80)  -20 ext - locking - pain   0 ext 45 PROM - pain free and trigger free  PROM 50  Trigger attempt to do AROM  PROM 60 , block AROM 30 no triggering PROM 60 and block AROM 45   Thumb Radial abd/add (0-55) 50 60 60 60  60   Thumb Palmar abd/add (0-45) 70 60 70 70  70   Thumb Opposition to Small Finger Opposition to base of 5th - pain        PROM to base of 5th   Index MCP (0-90) 80  85       Index PIP (0-100) 95 95        Index DIP (0-70)           Long MCP (0-90) 80   90       Long PIP (0-100) 100  100       Long DIP (0-70)           Ring MCP (0-90) 80  90        Ring PIP (0-100) 100  100        Ring DIP (0-70)           Little MCP (0-90) 80  90        Little PIP (0-100)  100 95        Little DIP (0-70)           (Blank rows = not tested)   UPPER EXTREMITY MMT:  Strength in wrist in all planes - 4+/5 - no pain     HAND FUNCTION: EVAL : Grip strength: Right: 22 lbs; Left: 55 lbs, Lateral pinch: Right: 8 pain 5/10 lbs, Left: 11 lbs, and 3 point pinch: Right: 11 lbs, Left: 11 lbs  12/05/22: Grip strength: Right: 50 lbs pain ; Left: 55 lbs, Lateral pinch: Right: 8 lbs pain 3/10,  Left: 11 lbs, and 3 point pinch: Right: 8 lbs, Left: 11 lbs   SENSATION: Pt report CT symptoms- numbness but splints helps at night time- but when gripping object , pinching can go numb   EDEMA:   R thumb and MC's at R hand   COGNITION: Overall cognitive status: Within functional limits for tasks assessed       TODAY'S TREATMENT:  DATE: 11/18 /24 Pt coming in today done PROM for thumb IP and MC flexion and blocked gentle AROM TO IP -but show decrease extention of IP of thumb -  REINFORCE to focus on IP flexion BUT ALSO EXTENTION   Cont to show  progress in  passive range of motion to right thumb IP and MC flexion pain-free to about 600 degrees at IP and 60 degrees at the [facility] And  flexion to base of 5th  Able to do blocked AROM IP 45  with no triggering  BUT LOST extention IP  Pain with PROM IP hyper ext   Patient ed on doing pain free passive range of motion at home  at thumb IP and MC - composite pain free BUT EXTENTION TOO  After contrast  FLuidotherapy done for thumb PA and RA - wrist and oppositoin to L  Pain free and no triggering on R to 2nd and 3rd  Ice rotation 2 x  10 min - decrease pain and stiffness - prior to tendon glides    Ice massage several times a day right thumb A1 pulley  Pt to cont with  double bandaid to maintain IP extention during day and night time  on R 9 th  session of Ionto done this date - pt was out of town 2 wks ago earlier in tx Ionto with dexamethazone for R thumb A1pulley - 19 min - med patch at 2.0 current - tolerate well and skin check done prior pt to keep patch on for hour afterwards   PATIENT EDUCATION: Education details: findings of eval and HEP -joint protection and modifications Person educated: Patient Education method: Explanation, Demonstration, Tactile cues, Verbal cues, and Handouts Education comprehension: verbalized understanding, returned demonstration, verbal cues required, and needs further education    GOALS: Goals reviewed with patient? Yes  SHORT TERM GOALS: Target date: 2 wks  Pain and tenderness decreased in right thumb to less than a 5/10 patient able to maintain thumb IP extension and resting without splint. Baseline: Patient unable to maintain IP extension of right thumb.  locking at -20 degrees-tenderness over thumb A1 pulley 5-8/10 as well as with passive extension of thumb IP Goal status: INITIAL    LONG TERM GOALS: Target date: 8 wk  Patient to be independent in home program to maintain digits active range of bilateral hands within normal limits and symptom-free including left thumb Baseline: Patient with increased stiffness and tightness over right metacarpals and decrease motion, left  hand stiffness and tightness with pain can increase at the most to 8/10 in the right hand and 5/10 in the left hand Goal status: INITIAL  2.  Right thumb active range of motion increased to within functional limits and basic ADLs with triggering less than 1 time a day  baseline: Right thumb locks to -20 degrees extension at rest ;unable to maintain full extension.  Tenderness over the A1 pulley 5/10 Goal status: INITIAL  3.  Right grip strength increased to more than 60% compared to the left for patient to be able to grip utensils, brush and toothbrush without increase symptoms Baseline: Grip on the right dominant hand 22 pounds and left 55 pounds-patient report difficulty gripping objects Goal status: INITIAL  4.  Patient to verbalize 3 joint protection and modifications as well as adaptive equipment to decrease pain in bilateral hands as well as triggering. 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  Patient present at OT evaluation with increased pain in bilateral hands with the right worse than the left.  5-8/10 pain with functional use.  Patient has a history of bilateral carpal tunnel symptoms.  Patient reports she sleeps with her wrist braces and has numbness  at night or in am.  Pt made great progress in thumb pain and stiffness since eval - was 2 x out of town for week - but show increase PROM of thumb IP and MC pain free-and initiate last week some AROM pain and trigger free- but cont to be tender over A1 pulley - was at eval 10/10 - now 2-4/10   Pt to focus on maintaining her hyper extention at IP - now that adding some light , pain free IP and MC flexion - cont   pain free tendon glides - 9th  session Ionto  done this date - tolerate well.  Active range of motion within functional limits with stiffness more than pain in L hand and bilateral wrist.  Patient with decreased grip and prehension strength on the right dominant hand.  Patient limited in performing ADLs and IADLs involving gripping, pinching, lifting, pulling and carrying.  PERFORMANCE DEFICITS: in functional skills including ADLs, IADLs, coordination, dexterity, sensation, ROM, strength, pain, flexibility, Fine motor control, decreased knowledge of use of DME, and UE functional use, , and psychosocial skills including environmental adaptation and routines and behaviors.   IMPAIRMENTS: are limiting patient from ADLs, IADLs, rest and sleep, play, leisure, and social participation.   COMORBIDITIES: may have co-morbidities  that affects occupational performance. Patient will benefit from skilled OT to address above impairments and improve overall function.  MODIFICATION OR ASSISTANCE TO COMPLETE EVALUATION: Min-Moderate modification of tasks or assist with assess necessary to complete an evaluation.  OT OCCUPATIONAL PROFILE AND HISTORY: Detailed assessment: Review of records and additional review of physical, cognitive, psychosocial history related to current functional performance.  CLINICAL DECISION MAKING: Moderate - several treatment options, min-mod task modification necessary  REHAB POTENTIAL: Good for digits- fair for R trigger thumb  EVALUATION COMPLEXITY: Moderate      PLAN:  OT FREQUENCY: 2x/week  OT DURATION: 8 weeks  PLANNED INTERVENTIONS: self care/ADL training, therapeutic exercise, manual therapy, passive range of motion, splinting, ultrasound, iontophoresis, paraffin, fluidotherapy, moist heat, cryotherapy, contrast bath, patient/family education, coping strategies training, and DME and/or AE instructions     CONSULTED AND AGREED WITH PLAN OF CARE:  Patient     Oletta Cohn, OTR/L,CLT 01/01/2023, 3:37 PM

## 2023-01-04 ENCOUNTER — Ambulatory Visit: Payer: BC Managed Care – PPO | Admitting: Occupational Therapy

## 2023-01-04 DIAGNOSIS — M25641 Stiffness of right hand, not elsewhere classified: Secondary | ICD-10-CM

## 2023-01-04 DIAGNOSIS — M79641 Pain in right hand: Secondary | ICD-10-CM

## 2023-01-04 DIAGNOSIS — M6281 Muscle weakness (generalized): Secondary | ICD-10-CM

## 2023-01-04 DIAGNOSIS — M79642 Pain in left hand: Secondary | ICD-10-CM

## 2023-01-04 DIAGNOSIS — M25642 Stiffness of left hand, not elsewhere classified: Secondary | ICD-10-CM

## 2023-01-04 DIAGNOSIS — M65311 Trigger thumb, right thumb: Secondary | ICD-10-CM

## 2023-01-04 NOTE — Therapy (Signed)
OUTPATIENT OCCUPATIONAL THERAPY ORTHO TREATMENT  Patient Name: Valerie Underwood MRN: [id number] DOB:05-07-1970, 52 y.o., female Today's Date: 01/04/2023  PCP: Merlinda Frederick NP REFERRING PROVIDER: Defoor PA  END OF SESSION:  OT End of Session - 01/04/23 1311     Visit Number 11    Date for OT Re-Evaluation 01/29/23    OT Start Time 1201    OT Stop Time 1249    OT Time Calculation (min) 48 min    Activity Tolerance Patient tolerated treatment well    Behavior During Therapy WFL for tasks assessed/performed             Past Medical History:  Diagnosis Date   Anemia    Depression    Eczema    History of traumatic brain injury 1997   Seizures (HCC)    hx- after TBI, last sz 2001   Uterine fibroid    history   Past Surgical History:  Procedure Laterality Date   COLONOSCOPY WITH PROPOFOL N/A 09/16/2019   Procedure: COLONOSCOPY WITH PROPOFOL;  Surgeon: Midge Minium, MD;  Location: [facility] ENDOSCOPY;  Service: Endoscopy;  Laterality: N/A;   DILATION AND CURETTAGE OF UTERUS  1988   UTERINE ARTERY EMBOLIZATION  2009   Patient Active Problem List   Diagnosis Date Noted   Encounter for screening colonoscopy    Polyp of transverse colon    Depression, recurrent (HCC) 05/02/2017   Family history of PMS (premenstrual syndrome) 05/17/2016   BMI 40.0-44.9, adult (HCC) 05/17/2016   Metatarsalgia 05/24/2011   Gait abnormality 05/24/2011   Hypertriglyceridemia 01/18/2011   Morbid obesity (HCC) 01/17/2011   Depression 01/17/2011   Anemia    Uterine fibroid    History of traumatic brain injury    Seizures (HCC)    Left foot pain 11/11/2010    ONSET DATE: 2/24  REFERRING DIAG: RA of multiple joints   THERAPY DIAG:  Pain in right hand  Stiffness of left hand, not elsewhere classified  Pain in left hand  Stiffness of right hand, not elsewhere classified  Trigger thumb of right hand  Muscle weakness (generalized)  Rationale for Evaluation and Treatment:  Rehabilitation  SUBJECTIVE:   SUBJECTIVE STATEMENT: You help me so much since the start.  I could barely use my hands.  Can even pull up my pants can use my thumb at all.  I did do some crafts this morning.  But had my splint and Band-Aid on my right thumb. 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  R thumb 4/10- tenderness over A1pulley  FALLS: Has patient fallen in last 6 months? Yes. 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  Has cats at home likes to read on her tablet, cleaning around the house on her phone about 2 hours a day.  PATIENT GOALS: Want the pain  in my hands better and  the stiffness - keep my motion and strength in  my hands to do the things I love and to be independent   NEXT MD VISIT: End of Oct  OBJECTIVE:  Note: Objective measures were completed at Evaluation unless otherwise noted.  HAND DOMINANCE: Right    UPPER EXTREMITY ROM:     Active ROM Right eval Left eval  Shoulder flexion    Shoulder abduction    Shoulder adduction    Shoulder extension    Shoulder internal rotation    Shoulder external rotation    Elbow flexion    Elbow extension    Wrist flexion 70 70  Wrist extension 80 80  Wrist ulnar deviation 30 30  Wrist radial deviation 20 1/10 pain  20  Wrist pronation    Wrist supination    (Blank rows = not tested)  Active ROM Right eval Left eval R 11/27/22 R 12/21/22 R 12/25/22 R 12/28/22 R 01/01/23  Thumb  MCP (0-60)    45 PROM PROM 60 PROM 60 PROM 60  Thumb IP (0-80) -20 ext - locking - pain   0 ext 45 PROM - pain free and trigger free  PROM 50  Trigger attempt to do AROM  PROM 60 , block AROM 30 no triggering PROM 60 and block AROM 45   Thumb Radial abd/add (0-55) 50 60 60 60  60   Thumb Palmar abd/add (0-45) 70 60 70 70  70   Thumb Opposition to Small Finger Opposition to base of 5th - pain        PROM to base of 5th   Index MCP (0-90) 80  85       Index PIP (0-100) 95 95        Index DIP (0-70)           Long MCP (0-90) 80   90       Long PIP (0-100) 100  100       Long DIP (0-70)           Ring MCP (0-90) 80  90        Ring PIP (0-100) 100  100        Ring DIP (0-70)           Little MCP (0-90) 80  90        Little PIP (0-100)  100 95        Little DIP (0-70)           (Blank rows = not tested)   UPPER EXTREMITY MMT:  Strength in wrist in all planes - 4+/5 - no pain     HAND FUNCTION: EVAL : Grip strength: Right: 22 lbs; Left: 55 lbs, Lateral pinch: Right: 8 pain 5/10 lbs, Left: 11 lbs, and 3 point pinch: Right: 11 lbs, Left: 11 lbs  12/05/22: Grip strength: Right: 50 lbs pain ; Left: 55 lbs, Lateral pinch: Right: 8 lbs pain 3/10,  Left: 11 lbs, and 3 point pinch: Right: 8 lbs, Left: 11 lbs   SENSATION: Pt report CT symptoms- numbness but splints helps at night time- but when gripping object , pinching can go numb   EDEMA:   R thumb and MC's at R hand   COGNITION: Overall cognitive status: Within functional limits for tasks assessed       TODAY'S TREATMENT:  DATE: 11/21 /24 Patient arrived this date with increased palmar radial abduction including IP hyperextension.  Done great with working on IP extension together with passive range of motion IP flexion. Continues to be 4/10 tenderness over A1 pulley.   Cont to show  progress in  passive range  of motion to right thumb IP and MC flexion pain-free to about 60 degrees at IP and 60 degrees at the [facility] And PROM flexion to base of 5th composite Patient can do the same at home Able to do blocked AROM IP 35 today-little tighter and tender because of using hand and crafts this morning  Patient ed on doing pain free passive range of motion at home  at thumb IP and MC - composite  PROM to base of 5th with pain free EXTENTION TOO  After contrast  FLuidotherapy done for thumb PA and RA - wrist and oppositoin to L  Pain free and no triggering on R to 2nd and 3rd but do not initiate IP flexion.  Ice rotation 2 x during [place]  8 min - decrease pain and stiffness - prior to tendon glides    Ice massage several times a day right thumb A1 pulley And patient reports using Voltaren 2 times a day  Pt to cont with  double bandaid to maintain IP extention during day and night time  on R 10 th  session of Ionto done this date - pt was out of town 2 wks ago earlier in tx Ionto with dexamethazone for R thumb A1pulley - 19 min - med patch at 2.0 current - tolerate well and skin check done prior pt to keep patch on for hour afterwards   PATIENT EDUCATION: Education details: findings of eval and HEP -joint protection and modifications Person educated: Patient Education method: Explanation, Demonstration, Tactile cues, Verbal cues, and Handouts Education comprehension: verbalized understanding, returned demonstration, verbal cues required, and needs further education    GOALS: Goals reviewed with patient? Yes  SHORT TERM GOALS: Target date: 2 wks  Pain and tenderness decreased in right thumb to less than a 5/10 patient able to maintain thumb IP extension and resting without splint. Baseline: Patient unable to maintain IP extension of right thumb.  locking at -20 degrees-tenderness over thumb A1 pulley 5-8/10 as well as with passive extension of thumb IP Goal status: INITIAL    LONG TERM GOALS:  Target date: 8 wk  Patient to be independent in home program to maintain digits active range of bilateral hands within normal limits and symptom-free including left thumb Baseline: Patient with increased stiffness and tightness over right metacarpals and decrease motion, left hand stiffness and tightness with pain can increase at the most to 8/10 in the right hand and 5/10 in the left hand Goal status: INITIAL  2.  Right thumb active range of motion increased to within functional limits and basic ADLs with triggering less than 1 time a day  baseline: Right thumb locks to -20 degrees extension at rest ;unable to maintain full extension.  Tenderness over the A1 pulley 5/10 Goal status: INITIAL  3.  Right grip strength increased to more than 60% compared to the left for patient to be able to grip utensils, brush and toothbrush without increase symptoms Baseline: Grip on the right dominant hand 22 pounds and left 55 pounds-patient report difficulty gripping objects Goal status: INITIAL  4.  Patient to verbalize 3 joint protection and modifications as well as adaptive equipment to decrease pain in bilateral hands  as well as triggering. 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  Patient present at OT evaluation with increased pain in bilateral hands with the right worse than the left.  5-8/10 pain with functional use.  Patient has a history of bilateral carpal tunnel symptoms.  Patient reports she sleeps with her wrist braces and has numbness  at night or in am.  Pt made great progress in thumb pain and stiffness since eval - was 2 x out of town for week - but show increase PROM  of thumb IP and MC pain free- PROM composite to base of 5th- IP AROM flexion 35 -  initiate last week some AROM pain and trigger free- but cont to be tender over A1 pulley - was at eval 10/10 - now 2-4/10 - cannot initiate IP flexion during opposition yet without triggering.  10th  session Ionto  done this date - tolerate well.  Active range of motion within functional limits with stiffness more than pain in L hand and bilateral wrist.  Patient with decreased grip and prehension strength on the right dominant hand.  Patient limited in performing ADLs and IADLs involving gripping, pinching, lifting, pulling and carrying.  PERFORMANCE DEFICITS: in functional skills including ADLs, IADLs, coordination, dexterity, sensation, ROM, strength, pain, flexibility, Fine motor control, decreased knowledge of use of DME, and UE functional use, , and psychosocial skills including environmental adaptation and routines and behaviors.   IMPAIRMENTS: are limiting patient from ADLs, IADLs, rest and sleep, play, leisure, and social participation.   COMORBIDITIES: may have co-morbidities  that affects occupational performance. Patient will benefit from skilled OT to address above impairments and improve overall function.  MODIFICATION OR ASSISTANCE TO COMPLETE EVALUATION: Min-Moderate modification of tasks or assist with assess necessary to complete an evaluation.  OT OCCUPATIONAL PROFILE AND HISTORY: Detailed assessment: Review of records and additional review of physical, cognitive, psychosocial history related to current functional performance.  CLINICAL DECISION MAKING: Moderate - several treatment options, min-mod task modification necessary  REHAB POTENTIAL: Good for digits- fair for R trigger thumb  EVALUATION COMPLEXITY: Moderate      PLAN:  OT FREQUENCY: 2x/week  OT DURATION: 8 weeks  PLANNED INTERVENTIONS: self care/ADL training, therapeutic exercise, manual therapy, passive range of motion,  splinting, ultrasound, iontophoresis, paraffin, fluidotherapy, moist heat, cryotherapy, contrast bath, patient/family education, coping strategies training, and DME and/or AE instructions     CONSULTED AND AGREED WITH PLAN OF CARE: Patient     Oletta Cohn, OTR/L,CLT 01/04/2023, 1:12 PM

## 2023-01-08 ENCOUNTER — Ambulatory Visit: Payer: BC Managed Care – PPO | Admitting: Occupational Therapy

## 2023-01-08 DIAGNOSIS — M65311 Trigger thumb, right thumb: Secondary | ICD-10-CM

## 2023-01-08 DIAGNOSIS — M25641 Stiffness of right hand, not elsewhere classified: Secondary | ICD-10-CM

## 2023-01-08 DIAGNOSIS — M79641 Pain in right hand: Secondary | ICD-10-CM

## 2023-01-08 NOTE — Therapy (Signed)
OUTPATIENT OCCUPATIONAL THERAPY ORTHO TREATMENT  Patient Name: Valerie Underwood MRN: [id number] DOB:[birth year], 52 y.o., female Today's Date: 01/08/2023  PCP: Merlinda Frederick NP REFERRING PROVIDER: Defoor PA  END OF SESSION:  OT End of Session - 01/08/23 1242     Visit Number 12    Number of Visits 17    Date for OT Re-Evaluation 01/29/23    OT Start Time 1116    OT Stop Time 1202    OT Time Calculation (min) 46 min    Activity Tolerance Patient tolerated treatment well    Behavior During Therapy WFL for tasks assessed/performed             Past Medical History:  Diagnosis Date   Anemia    Depression    Eczema    History of traumatic brain injury 1997   Seizures (HCC)    hx- after TBI, last sz 2001   Uterine fibroid    history   Past Surgical History:  Procedure Laterality Date   COLONOSCOPY WITH PROPOFOL N/A 09/16/2019   Procedure: COLONOSCOPY WITH PROPOFOL;  Surgeon: Midge Minium, MD;  Location: [facility] ENDOSCOPY;  Service: Endoscopy;  Laterality: N/A;   DILATION AND CURETTAGE OF UTERUS  1988   UTERINE ARTERY EMBOLIZATION  2009   Patient Active Problem List   Diagnosis Date Noted   Encounter for screening colonoscopy    Polyp of transverse colon    Depression, recurrent (HCC) 05/02/2017   Family history of PMS (premenstrual syndrome) 05/17/2016   BMI 40.0-44.9, adult (HCC) 05/17/2016   Metatarsalgia 05/24/2011   Gait abnormality 05/24/2011   Hypertriglyceridemia 01/18/2011   Morbid obesity (HCC) 01/17/2011   Depression 01/17/2011   Anemia    Uterine fibroid    History of traumatic brain injury    Seizures (HCC)    Left foot pain 11/11/2010    ONSET DATE: 2/24  REFERRING DIAG: RA of multiple joints   THERAPY DIAG:  Stiffness of right hand, not elsewhere classified  Pain in right hand  Trigger thumb of right hand  Rationale for Evaluation and Treatment: Rehabilitation  SUBJECTIVE:   SUBJECTIVE STATEMENT: I done some crafts but try not to use my  thumb - it is tender today -done the motion  pt accompanied by: self  PERTINENT HISTORY: Last appt with Defoor PA - 11/02/22 - Pt Plaquenil since 09/07/22 - but cont to have some flare ups- thumb pain , increase swelling and and morning stiffness - Pt now on light dose of Methotrexate - 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  R thumb 3/10- tenderness over A1pulley  FALLS: Has patient fallen in last 6 months? Yes. 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  Has cats at home likes to read on her tablet, cleaning around the house on her phone about 2 hours a day.  PATIENT GOALS: Want the pain  in my hands better and  the stiffness - keep my motion and strength in my hands to do the things I love and to be independent   NEXT MD VISIT: End of Oct  OBJECTIVE:  Note: Objective measures were completed at Evaluation unless otherwise noted.  HAND DOMINANCE: Right  UPPER EXTREMITY ROM:     Active ROM Right eval Left eval  Shoulder flexion    Shoulder abduction    Shoulder adduction    Shoulder extension    Shoulder internal rotation    Shoulder external rotation    Elbow flexion    Elbow extension    Wrist flexion 70 70  Wrist extension 80 80  Wrist ulnar deviation 30 30  Wrist radial deviation 20 1/10 pain  20  Wrist pronation    Wrist supination    (Blank rows = not tested)  Active ROM Right eval Left eval R 11/27/22 R 12/21/22 R 12/25/22 R 12/28/22 R 01/01/23  Thumb MCP (0-60)    45 PROM PROM 60 PROM 60 PROM 60  Thumb IP (0-80) -20 ext - locking - pain   0 ext 45 PROM - pain free and trigger free  PROM 50  Trigger attempt to do AROM  PROM 60 , block AROM 30 no triggering PROM 60  and block AROM 45   Thumb Radial abd/add (0-55) 50 60 60 60  60   Thumb Palmar abd/add (0-45) 70 60 70 70  70   Thumb Opposition to Small Finger Opposition to base of 5th - pain        PROM to base of 5th   Index MCP (0-90) 80  85       Index PIP (0-100) 95 95        Index DIP (0-70)           Long MCP (0-90) 80   90       Long PIP (0-100) 100  100       Long DIP (0-70)           Ring MCP (0-90) 80  90        Ring PIP (0-100) 100  100        Ring DIP (0-70)           Little MCP (0-90) 80  90        Little PIP (0-100)  100 95        Little DIP (0-70)           (Blank rows = not tested)   UPPER EXTREMITY MMT:  Strength in wrist in all planes - 4+/5 - no pain     HAND FUNCTION: EVAL : Grip strength: Right: 22 lbs; Left: 55 lbs, Lateral pinch: Right: 8 pain 5/10 lbs, Left: 11 lbs, and 3 point pinch: Right: 11 lbs, Left: 11 lbs  12/05/22: Grip strength: Right: 50 lbs pain ; Left: 55 lbs, Lateral pinch: Right: 8 lbs pain 3/10,  Left: 11 lbs, and 3 point pinch: Right: 8 lbs, Left: 11 lbs   SENSATION: Pt report CT symptoms- numbness but splints helps at night time- but when gripping object , pinching can go numb   EDEMA:   R thumb and MC's at R hand   COGNITION: Overall cognitive status: Within functional limits for tasks assessed       TODAY'S TREATMENT:  DATE: 11/25 /24 Patient cont to show great  palmar /radial abduction including IP hyperextension.  Done great with working on IP extension together with passive range of motion IP flexion. But IP flexion PROM 50-55 And then causing some triggering with attempts of AROM blocked to IP flexion Decrease tenderness to 3/10 at  A1 pulley.   Cont to show  progress in  passive range of motion to right thumb MC flexion  and composite to base of 5th - pain-free 60 degrees at the [facility] but IP decrease to 50 Pt to  focus on decrease tenderness over weekend and PROM to IP flexion and extention pain free and no triggering  After contrast   thumb PA and RA - wrist and oppositoin to L  Graston tool nr 2 brusing and sweeping over volar thumb, thenar and volar wrist prior to  Pain free and no triggering PROM IP flexion , ext to 60  MC flexion -and composite PROM to base of 5th   Ice massage several times a day right thumb A1 pulley And patient reports using Voltaren 2 times a day  Pt to cont with  double bandaid to maintain IP extention during day and night time  on R 11 th  session of Ionto done this date - pt was out of town 2 wks ago earlier in tx Ionto with dexamethazone for R thumb A1pulley - 19 min - med patch at 2.0 current - tolerate well and skin check done prior pt to keep patch on for hour afterwards   PATIENT EDUCATION: Education details: findings of eval and HEP -joint protection and modifications Person educated: Patient Education method: Explanation, Demonstration, Tactile cues, Verbal cues, and Handouts Education comprehension: verbalized understanding, returned demonstration, verbal cues required, and needs further education    GOALS: Goals reviewed with patient? Yes  SHORT TERM GOALS: Target date: 2 wks  Pain and tenderness decreased in right thumb to less than a 5/10 patient able to maintain thumb IP extension and resting without splint. Baseline: Patient unable to maintain IP extension of right thumb.  locking at -20 degrees-tenderness over thumb A1 pulley 5-8/10 as well as with passive extension of thumb IP Goal status: INITIAL    LONG TERM GOALS: Target date: 8 wk  Patient to be independent in home program to maintain digits active range of bilateral hands within normal limits and symptom-free including left thumb Baseline: Patient with increased stiffness and tightness over right metacarpals and decrease motion, left hand stiffness and tightness with pain can increase at  the most to 8/10 in the right hand and 5/10 in the left hand Goal status: INITIAL  2.  Right thumb active range of motion increased to within functional limits and basic ADLs with triggering less than 1 time a day  baseline: Right thumb locks to -20 degrees extension at rest ;unable to maintain full extension.  Tenderness over the A1 pulley 5/10 Goal status: INITIAL  3.  Right grip strength increased to more than 60% compared to the left for patient to be able to grip utensils, brush and toothbrush without increase symptoms Baseline: Grip on the right dominant hand 22 pounds and left 55 pounds-patient report difficulty gripping objects Goal status: INITIAL  4.  Patient to verbalize 3 joint protection and modifications as well as adaptive equipment to decrease pain in bilateral hands as well as triggering. 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  Patient present at OT evaluation with increased pain in bilateral hands with the right worse than the left.  5-8/10 pain with functional use.  Patient has a history of bilateral carpal tunnel symptoms.  Patient reports she sleeps with her wrist braces and has numbness  at night or in am.  Pt made great progress in thumb pain and stiffness since eval - was 2 x out of town for week - but show increase PROM of thumb  MC pain free- PROM composite to base of 5th- IP PROM flexion and ext little more tight and causing when attempting IP  AROM flexion triggering- tenderness decrease to 3/10 - pt to focus on decrease tenderness and IP blocked PROM flexion and extention   11th  session Ionto  done this date - tolerate well.  Active range of  motion within functional limits with stiffness more than pain in L hand and bilateral wrist.  Patient with decreased grip and prehension strength on the right dominant hand.  Patient limited in performing ADLs and IADLs involving gripping, pinching, lifting, pulling and carrying.  PERFORMANCE DEFICITS: in functional skills including ADLs, IADLs, coordination, dexterity, sensation, ROM, strength, pain, flexibility, Fine motor control, decreased knowledge of use of DME, and UE functional use, , and psychosocial skills including environmental adaptation and routines and behaviors.   IMPAIRMENTS: are limiting patient from ADLs, IADLs, rest and sleep, play, leisure, and social participation.   COMORBIDITIES: may have co-morbidities  that affects occupational performance. Patient will benefit from skilled OT to address above impairments and improve overall function.  MODIFICATION OR ASSISTANCE TO COMPLETE EVALUATION: Min-Moderate modification of tasks or assist with assess necessary to complete an evaluation.  OT OCCUPATIONAL PROFILE AND HISTORY: Detailed assessment: Review of records and additional review of physical, cognitive, psychosocial history related to current functional performance.  CLINICAL DECISION MAKING: Moderate - several treatment options, min-mod task modification necessary  REHAB POTENTIAL: Good for digits- fair for R trigger thumb  EVALUATION COMPLEXITY: Moderate      PLAN:  OT FREQUENCY: 2x/week  OT DURATION: 8 weeks  PLANNED INTERVENTIONS: self care/ADL training, therapeutic exercise, manual therapy, passive range of motion, splinting, ultrasound, iontophoresis, paraffin, fluidotherapy, moist heat, cryotherapy, contrast bath, patient/family education, coping strategies training, and DME and/or AE instructions     CONSULTED AND AGREED WITH PLAN OF CARE: Patient     Oletta Cohn, OTR/L,CLT 01/08/2023, 12:45 PM

## 2023-01-18 ENCOUNTER — Ambulatory Visit: Payer: BC Managed Care – PPO | Attending: Rheumatology | Admitting: Occupational Therapy

## 2023-01-18 DIAGNOSIS — M25641 Stiffness of right hand, not elsewhere classified: Secondary | ICD-10-CM | POA: Insufficient documentation

## 2023-01-18 DIAGNOSIS — M79641 Pain in right hand: Secondary | ICD-10-CM | POA: Insufficient documentation

## 2023-01-18 DIAGNOSIS — M65311 Trigger thumb, right thumb: Secondary | ICD-10-CM | POA: Insufficient documentation

## 2023-01-18 NOTE — Therapy (Signed)
OUTPATIENT OCCUPATIONAL THERAPY ORTHO TREATMENT  Patient Name: Valerie Underwood MRN: [id number] DOB:1970/06/23, 52 y.o., female Today's Date: 01/18/2023  PCP: Merlinda Frederick NP REFERRING PROVIDER: Defoor PA  END OF SESSION:  OT End of Session - 01/18/23 1957     Visit Number 13    Number of Visits 17    Date for OT Re-Evaluation 01/29/23    OT Start Time 1202    OT Stop Time 1248    OT Time Calculation (min) 46 min    Activity Tolerance Patient tolerated treatment well    Behavior During Therapy WFL for tasks assessed/performed             Past Medical History:  Diagnosis Date   Anemia    Depression    Eczema    History of traumatic brain injury 1997   Seizures (HCC)    hx- after TBI, last sz 2001   Uterine fibroid    history   Past Surgical History:  Procedure Laterality Date   COLONOSCOPY WITH PROPOFOL N/A 09/16/2019   Procedure: COLONOSCOPY WITH PROPOFOL;  Surgeon: Midge Minium, MD;  Location: [facility] ENDOSCOPY;  Service: Endoscopy;  Laterality: N/A;   DILATION AND CURETTAGE OF UTERUS  1988   UTERINE ARTERY EMBOLIZATION  2009   Patient Active Problem List   Diagnosis Date Noted   Encounter for screening colonoscopy    Polyp of transverse colon    Depression, recurrent (HCC) 05/02/2017   Family history of PMS (premenstrual syndrome) 05/17/2016   BMI 40.0-44.9, adult (HCC) 05/17/2016   Metatarsalgia 05/24/2011   Gait abnormality 05/24/2011   Hypertriglyceridemia 01/18/2011   Morbid obesity (HCC) 01/17/2011   Depression 01/17/2011   Anemia    Uterine fibroid    History of traumatic brain injury    Seizures (HCC)    Left foot pain 11/11/2010    ONSET DATE: 2/24  REFERRING DIAG: RA of multiple joints   THERAPY DIAG:  Pain in right hand  Trigger thumb of right hand  Stiffness of right hand, not elsewhere classified  Rationale for Evaluation and Treatment: Rehabilitation  SUBJECTIVE:   SUBJECTIVE STATEMENT: We have so much sleeves around the house I  have to blow them every day.  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  R thumb 3/10- tenderness over A1pulley  FALLS: Has patient fallen in last 6 months? Yes. 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  Has cats at home likes to read on her tablet, cleaning around the house on her phone about 2 hours a day.  PATIENT GOALS: Want the pain  in my hands better and  the stiffness - keep my motion and strength in my hands to do the things I love and to be independent   NEXT MD VISIT: End of Oct  OBJECTIVE:  Note: Objective measures were completed at Evaluation unless otherwise noted.  HAND DOMINANCE: Right    UPPER EXTREMITY ROM:     Active ROM Right eval Left eval  Shoulder flexion    Shoulder abduction    Shoulder adduction    Shoulder extension    Shoulder internal rotation    Shoulder external rotation    Elbow flexion    Elbow extension    Wrist flexion 70 70  Wrist extension 80 80  Wrist ulnar deviation 30 30  Wrist radial deviation 20 1/10 pain  20  Wrist pronation    Wrist supination    (Blank rows = not tested)  Active ROM Right eval Left eval R 11/27/22 R 12/21/22 R 12/25/22 R 12/28/22 R 01/01/23  Thumb MCP (0-60)    45 PROM PROM 60 PROM 60 PROM 60  Thumb IP (0-80) -20 ext - locking - pain   0 ext 45 PROM - pain free and trigger free  PROM  50  Trigger attempt to do AROM  PROM 60 , block AROM 30 no triggering PROM 60 and block AROM 45   Thumb Radial abd/add (0-55) 50 60 60 60  60   Thumb Palmar abd/add (0-45) 70 60 70 70  70   Thumb Opposition to Small Finger Opposition to base of 5th - pain        PROM to base of 5th   Index MCP (0-90) 80  85       Index PIP (0-100) 95 95        Index DIP (0-70)           Long MCP (0-90) 80   90       Long PIP (0-100) 100  100       Long DIP (0-70)           Ring MCP (0-90) 80  90        Ring PIP (0-100) 100  100        Ring DIP (0-70)           Little MCP (0-90) 80  90        Little PIP (0-100)  100 95        Little DIP (0-70)           (Blank rows = not tested)   UPPER EXTREMITY MMT:  Strength in wrist in all planes - 4+/5 - no pain     HAND FUNCTION: EVAL : Grip strength: Right: 22 lbs; Left: 55 lbs, Lateral pinch: Right: 8 pain 5/10 lbs, Left: 11 lbs, and 3 point pinch: Right: 11 lbs, Left: 11 lbs  12/05/22: Grip strength: Right: 50 lbs pain ; Left: 55 lbs, Lateral pinch: Right: 8 lbs pain 3/10,  Left: 11 lbs, and 3 point pinch: Right: 8 lbs, Left: 11 lbs   SENSATION: Pt report CT symptoms- numbness but splints helps at night time- but when gripping object , pinching can go numb   EDEMA:   R thumb and MC's at R hand   COGNITION: Overall cognitive status: Within functional limits for tasks assessed       TODAY'S TREATMENT:  DATE: 01/18/23 Patient cont to show great  palmar /radial abduction -done great progress in passive range of motion of  IP flexion.  Did lose a little bit of hyperextension of IP the last week IP flexion PROM 60-and blocked active range of motion 45 degrees of IP flexion but continues to lock/trigger when attempting to go past 45 degrees Decrease tenderness to 3/10 at  A1 pulley. Patient also can do pain-free passive range of  motion for composite thumb flexion to base of fifth. But Discussed with patient this date that would recommend her to contact and look into orthopedics for possible trigger thumb release.   Pt to cont with  double bandaid to maintain IP extention during day and night time  on R 12 th  session of Ionto done this date - pt was out of town 2 wks ago earlier in tx Ionto with dexamethazone for R thumb A1pulley - 19 min - med patch at 2.0 current - tolerate well and skin check done prior pt to keep patch on for hour afterwards   PATIENT EDUCATION: Education details: findings of eval and HEP -joint protection and modifications Person educated: Patient Education method: Explanation, Demonstration, Tactile cues, Verbal cues, and Handouts Education comprehension: verbalized understanding, returned demonstration, verbal cues required, and needs further education    GOALS: Goals reviewed with patient? Yes  SHORT TERM GOALS: Target date: 2 wks  Pain and tenderness decreased in right thumb to less than a 5/10 patient able to maintain thumb IP extension and resting without splint. Baseline: Patient unable to maintain IP extension of right thumb.  locking at -20 degrees-tenderness over thumb A1 pulley 5-8/10 as well as with passive extension of thumb IP Goal status: INITIAL    LONG TERM GOALS: Target date: 8 wk  Patient to be independent in home program to maintain digits active range of bilateral hands within normal limits and symptom-free including left thumb Baseline: Patient with increased stiffness and tightness over right metacarpals and decrease motion, left hand stiffness and tightness with pain can increase at the most to 8/10 in the right hand and 5/10 in the left hand Goal status: INITIAL  2.  Right thumb active range of motion increased to within functional limits and basic ADLs with triggering less than 1 time a day  baseline: Right thumb locks to -20 degrees extension at rest ;unable  to maintain full extension.  Tenderness over the A1 pulley 5/10 Goal status: INITIAL  3.  Right grip strength increased to more than 60% compared to the left for patient to be able to grip utensils, brush and toothbrush without increase symptoms Baseline: Grip on the right dominant hand 22 pounds and left 55 pounds-patient report difficulty gripping objects Goal status: INITIAL  4.  Patient to verbalize 3 joint protection and modifications as well as adaptive equipment to decrease pain in bilateral hands as well as triggering. 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  Patient present at OT evaluation with increased pain in bilateral hands with the right worse than the left.  5-8/10 pain with functional use.  Patient has a history of bilateral carpal tunnel symptoms.  Patient reports she sleeps with her wrist braces and has numbness  at night  or in am.  Pt made great progress in thumb pain and stiffness since eval - was 2 x out of town for week - but show increase PROM of thumb  MC and IP flexion.  As well as PROM composite to base of 5th.  But with any attempt of IP AROM flexion -thumb is locking /triggering -tenderness decrease to 3/10 - 12th  session Ionto  done this date - tolerate well.  Great progress with passive range of motion of thumb but any attempts for active motion patient continues to trigger and lock.  At this time had a discussion with patient about contacting orthopedics and would recommend a consult for possible trigger finger release.  Patient made great progress in pain and edema and range of motion to help with rehab after possible surgery.  Patient with  decreased grip and prehension strength on the right dominant hand.  Patient limited in performing ADLs and IADLs involving gripping, pinching, lifting, pulling and carrying.  PERFORMANCE DEFICITS: in functional skills including ADLs, IADLs, coordination, dexterity, sensation, ROM, strength, pain, flexibility, Fine motor control, decreased knowledge of use of DME, and UE functional use, , and psychosocial skills including environmental adaptation and routines and behaviors.   IMPAIRMENTS: are limiting patient from ADLs, IADLs, rest and sleep, play, leisure, and social participation.   COMORBIDITIES: may have co-morbidities  that affects occupational performance. Patient will benefit from skilled OT to address above impairments and improve overall function.  MODIFICATION OR ASSISTANCE TO COMPLETE EVALUATION: Min-Moderate modification of tasks or assist with assess necessary to complete an evaluation.  OT OCCUPATIONAL PROFILE AND HISTORY: Detailed assessment: Review of records and additional review of physical, cognitive, psychosocial history related to current functional performance.  CLINICAL DECISION MAKING: Moderate - several treatment options, min-mod task modification necessary  REHAB POTENTIAL: Good for digits- fair for R trigger thumb  EVALUATION COMPLEXITY: Moderate      PLAN:  OT FREQUENCY: 2x/week  OT DURATION: 8 weeks  PLANNED INTERVENTIONS: self care/ADL training, therapeutic exercise, manual therapy, passive range of motion, splinting, ultrasound, iontophoresis, paraffin, fluidotherapy, moist heat, cryotherapy, contrast bath, patient/family education, coping strategies training, and DME and/or AE instructions     CONSULTED AND AGREED WITH PLAN OF CARE: Patient     Oletta Cohn, OTR/L,CLT 01/18/2023, 8:09 PM

## 2023-01-19 ENCOUNTER — Encounter: Payer: Self-pay | Admitting: Occupational Therapy

## 2023-01-22 ENCOUNTER — Ambulatory Visit: Payer: BC Managed Care – PPO | Admitting: Occupational Therapy

## 2023-01-23 ENCOUNTER — Encounter: Payer: BC Managed Care – PPO | Admitting: Occupational Therapy

## 2023-01-25 ENCOUNTER — Encounter: Payer: BC Managed Care – PPO | Admitting: Occupational Therapy

## 2023-02-25 ENCOUNTER — Telehealth: Payer: BC Managed Care – PPO | Admitting: Family

## 2023-02-25 DIAGNOSIS — L03012 Cellulitis of left finger: Secondary | ICD-10-CM | POA: Diagnosis not present

## 2023-02-25 MED ORDER — CEPHALEXIN 500 MG PO CAPS: 500.0000 mg | ORAL_CAPSULE | Freq: Three times a day (TID) | ORAL | 0 refills | Status: AC

## 2023-02-25 NOTE — Patient Instructions (Signed)
 Paronychia Paronychia is an infection of the skin that surrounds a nail. It usually affects the skin around a fingernail, but it may also occur near a toenail. It often causes pain and swelling around the nail. In some cases, a collection of pus (abscess) can form near or under the nail.  This condition may develop suddenly, or it may develop gradually over a longer period. In most cases, paronychia is not serious, and it will clear up with treatment. What are the causes? This condition may be caused by bacteria or a fungus, such as yeast. The bacteria or fungus can enter the body through an opening in the skin, such as a cut or a hangnail, and cause an infection in your fingernail or toenail. Other causes may include: Recurrent injury to the fingernail or toenail area. Irritation of the base and sides of the nail (cuticle). Injury and irritation can result in inflammation, swelling, and thickened skin around the nail. What increases the risk? This condition is more likely to develop in people who: Get their hands wet often, such as those who work as Fish farm manager, bartenders, or housekeepers. Bite their fingernails or cuticles. Have underlying skin conditions. Have hangnails or injured fingertips. Are exposed to irritants like detergents and other chemicals. Have diabetes. What are the signs or symptoms? Symptoms of this condition include: Redness and swelling of the skin near the nail. Tenderness around the nail when you touch the area. Pus-filled bumps under the cuticle. Fluid or pus under the nail. Throbbing pain in the area. How is this diagnosed? This condition is diagnosed with a physical exam. In some cases, a sample of pus may be tested to determine what type of bacteria or fungus is causing the condition. How is this treated? Treatment depends on the cause and severity of your condition. If your condition is mild, it may clear up on its own in a few days or after soaking in warm  water. If needed, treatment may include: Antibiotic medicine, if your infection is caused by bacteria. Antifungal medicine, if your infection is caused by a fungus. A procedure to drain pus from an abscess. Anti-inflammatory medicine (corticosteroids). Removal of part of an ingrown toenail. A bandage (dressing) may be placed over the affected area if an abscess or part of a nail has been removed. Follow these instructions at home: Wound care Keep the affected area clean. Soak the affected area in warm water if told to do so by your health care provider. You may be told to do this for 20 minutes, 2-3 times a day. Keep the area dry when you are not soaking it. Do not try to drain an abscess yourself. Follow instructions from your health care provider about how to take care of the affected area. Make sure you: Wash your hands with soap and water for at least 20 seconds before and after you change your dressing. If soap and water are not available, use hand sanitizer. Change your dressing as told by your health care provider. If you had an abscess drained, check the area every day for signs of infection. Check for: Redness, swelling, or pain. Fluid or blood. Warmth. Pus or a bad smell. Medicines  Take over-the-counter and prescription medicines only as told by your health care provider. If you were prescribed an antibiotic medicine, take it as told by your health care provider. Do not stop taking the antibiotic even if you start to feel better. General instructions Avoid contact with any skin irritants or allergens.  Do not pick at the affected area. Keep all follow-up visits as told. This is important. Prevention To prevent this condition from happening again: Wear rubber gloves when washing dishes or doing other tasks that require your hands to get wet. Wear gloves if your hands might come in contact with cleaners or other chemicals. Avoid injuring your nails or fingertips. Do not bite  your nails or tear hangnails. Do not cut your nails very short. Do not cut your cuticles. Use clean nail clippers or scissors when trimming nails. Contact a health care provider if: Your symptoms get worse or do not improve with treatment. You have continued or increased fluid, blood, or pus coming from the affected area. Your affected finger, toe, or joint becomes swollen or difficult to move. You have a fever or chills. There is redness spreading away from the affected area. Summary Paronychia is an infection of the skin that surrounds a nail. It often causes pain and swelling around the nail. In some cases, a collection of pus (abscess) can form near or under the nail. This condition may be caused by bacteria or a fungus. These germs can enter the body through an opening in the skin, such as a cut or a hangnail. If your condition is mild, it may clear up on its own in a few days. If needed, treatment may include medicine or a procedure to drain pus from an abscess. To prevent this condition from happening again, wear gloves if doing tasks that require your hands to get wet or to come in contact with chemicals. Also avoid injuring your nails or fingertips. This information is not intended to replace advice given to you by your health care provider. Make sure you discuss any questions you have with your health care provider. Document Revised: 05/03/2020 Document Reviewed: 05/03/2020 Elsevier Patient Education  2024 [employer].

## 2023-02-25 NOTE — Progress Notes (Signed)
 Virtual Visit Consent   Valerie Underwood, you are scheduled for a virtual visit with a [facility] provider today. Just as with appointments in the office, your consent must be obtained to participate. Your consent will be active for this visit and any virtual visit you may have with one of our providers in the next 365 days. If you have a MyChart account, a copy of this consent can be sent to you electronically.  As this is a virtual visit, video technology does not allow for your provider to perform a traditional examination. This may limit your provider's ability to fully assess your condition. If your provider identifies any concerns that need to be evaluated in person or the need to arrange testing (such as labs, EKG, etc.), we will make arrangements to do so. Although advances in technology are sophisticated, we cannot ensure that it will always work on either your end or our end. If the connection with a video visit is poor, the visit may have to be switched to a telephone visit. With either a video or telephone visit, we are not always able to ensure that we have a secure connection.  By engaging in this virtual visit, you consent to the provision of healthcare and authorize for your insurance to be billed (if applicable) for the services provided during this visit. Depending on your insurance coverage, you may receive a charge related to this service.  I need to obtain your verbal consent now. Are you willing to proceed with your visit today? [name] has provided verbal consent on 02/25/2023 for a virtual visit (video or telephone). [name], FNP  Date: 02/25/2023 7:06 PM  Virtual Visit via Video Note   I, [name], connected with  Valerie Underwood  ([id number], September 15, 1970) on 02/25/23 at  7:15 PM EST by a video-enabled telemedicine application and verified that I am speaking with the correct person using two identifiers.  Location: Patient: Virtual Visit Location Patient:  Home Provider: Virtual Visit Location Provider: Home Office   I discussed the limitations of evaluation and management by telemedicine and the availability of in person appointments. The patient expressed understanding and agreed to proceed.    History of Present Illness: Valerie Underwood is a 53 y.o. who identifies as a female who was assigned female at birth, and is being seen today for left fifth digit swelling and tenderness. She had a manicure on Friday and noticed her cuticle was cut and was bleeding. She noticed swelling and tenderness Friday evening, however, it has has worsen. She reports aching pain of 6 out 10. She has not taken anything for it.   HPI: HPI  Problems:  Patient Active Problem List   Diagnosis Date Noted   Encounter for screening colonoscopy    Polyp of transverse colon    Depression, recurrent (HCC) 05/02/2017   Family history of PMS (premenstrual syndrome) 05/17/2016   BMI 40.0-44.9, adult (HCC) 05/17/2016   Metatarsalgia 05/24/2011   Gait abnormality 05/24/2011   Hypertriglyceridemia 01/18/2011   Morbid obesity (HCC) 01/17/2011   Depression 01/17/2011   Anemia    Uterine fibroid    History of traumatic brain injury    Seizures (HCC)    Left foot pain 11/11/2010    Allergies:  Allergies  Allergen Reactions   Statins Other (See Comments)    Body aches   Benadryl [Diphenhydramine] Palpitations    IV only-can take oral   Medications:  Current Outpatient Medications:    cephALEXin  (  KEFLEX ) 500 MG capsule, Take 1 capsule (500 mg total) by mouth 3 (three) times daily for 7 days., Disp: 21 capsule, Rfl: 0   clobetasol  cream (TEMOVATE ) 0.05 %, Apply 1 application topically 2 (two) times daily., Disp: 30 g, Rfl: 11   fluticasone (FLONASE) 50 MCG/ACT nasal spray, Place 2 sprays into both nostrils daily., Disp: , Rfl:    influenza vac split quadrivalent PF (FLUARIX  QUADRIVALENT) 0.5 ML injection, Inject into the muscle., Disp: 0.5 mL, Rfl: 0   ketorolac   (TORADOL ) 10 MG tablet, ketorolac  10 mg tablet, Disp: , Rfl:    meclizine (ANTIVERT) 25 MG tablet, meclizine 25 mg tablet, Disp: , Rfl:    Multiple Vitamin (MULTIVITAMIN) tablet, Take 1 tablet by mouth daily.  , Disp: , Rfl:    mupirocin  ointment (BACTROBAN ) 2 %, Apply 1 application topically 3 (three) times daily., Disp: 22 g, Rfl: 0   pantoprazole (PROTONIX) 40 MG tablet, Take 40 mg by mouth daily., Disp: , Rfl:    Phendimetrazine Tartrate 35 MG TABS, Take 1 tablet by mouth 3 (three) times daily., Disp: , Rfl:    sertraline  (ZOLOFT ) 100 MG tablet, Take 1 tablet (100 mg total) by mouth daily. PRN for menstrual cycle, Disp: 90 tablet, Rfl: 1   Sodium Sulfate-Mag Sulfate-KCl (SUTAB ) [id number] MG TABS, Take 12 tablets by mouth in the morning and at bedtime. At 5pm take 12 tablets and then 5 hours prior to colonoscopy take the other 12., Disp: 24 tablet, Rfl: 0  Observations/Objective: Patient is well-developed, well-nourished in no acute distress.  Resting comfortably  at home.  Head is normocephalic, atraumatic.  No labored breathing.  Speech is clear and coherent with logical content.  Patient is alert and oriented at baseline.  Swelling of left media fifth finger with redness and tenderness   Assessment and Plan: 1. Paronychia of finger of left hand (Primary) - cephALEXin  (KEFLEX ) 500 MG capsule; Take 1 capsule (500 mg total) by mouth 3 (three) times daily for 7 days.  Dispense: 21 capsule; Refill: 0  Cool compresses  Keep clean and dry  Avoid picky or squeezing  Start Keflex   Follow up if symptoms worsen or do not improve   Follow Up Instructions: I discussed the assessment and treatment plan with the patient. The patient was provided an opportunity to ask questions and all were answered. The patient agreed with the plan and demonstrated an understanding of the instructions.  A copy of instructions were sent to the patient via MyChart unless otherwise noted below.    The patient  was advised to call back or seek an in-person evaluation if the symptoms worsen or if the condition fails to improve as anticipated.    [name], FNP

## 2023-03-13 ENCOUNTER — Ambulatory Visit: Payer: BC Managed Care – PPO | Attending: Rheumatology | Admitting: Occupational Therapy

## 2023-03-13 DIAGNOSIS — M79641 Pain in right hand: Secondary | ICD-10-CM | POA: Insufficient documentation

## 2023-03-13 DIAGNOSIS — M65311 Trigger thumb, right thumb: Secondary | ICD-10-CM | POA: Diagnosis present

## 2023-03-13 NOTE — Therapy (Signed)
OUTPATIENT OCCUPATIONAL THERAPY ORTHO TREATMENT  Patient Name: Valerie Underwood MRN: [id number] DOB:1970/06/18, 53 y.o., female Today's Date: 03/13/2023  PCP: Merlinda Frederick NP REFERRING PROVIDER: Defoor PA  END OF SESSION:  OT End of Session - 03/13/23 1340     Visit Number 14    Number of Visits 14    Date for OT Re-Evaluation 03/13/23    OT Start Time 1315    OT Stop Time 1339    OT Time Calculation (min) 24 min    Activity Tolerance Patient tolerated treatment well    Behavior During Therapy WFL for tasks assessed/performed             Past Medical History:  Diagnosis Date   Anemia    Depression    Eczema    History of traumatic brain injury 1997   Seizures (HCC)    hx- after TBI, last sz 2001   Uterine fibroid    history   Past Surgical History:  Procedure Laterality Date   COLONOSCOPY WITH PROPOFOL N/A 09/16/2019   Procedure: COLONOSCOPY WITH PROPOFOL;  Surgeon: Midge Minium, MD;  Location: [facility] ENDOSCOPY;  Service: Endoscopy;  Laterality: N/A;   DILATION AND CURETTAGE OF UTERUS  1988   UTERINE ARTERY EMBOLIZATION  2009   Patient Active Problem List   Diagnosis Date Noted   Encounter for screening colonoscopy    Polyp of transverse colon    Depression, recurrent (HCC) 05/02/2017   Family history of PMS (premenstrual syndrome) 05/17/2016   BMI 40.0-44.9, adult (HCC) 05/17/2016   Metatarsalgia 05/24/2011   Gait abnormality 05/24/2011   Hypertriglyceridemia 01/18/2011   Morbid obesity (HCC) 01/17/2011   Depression 01/17/2011   Anemia    Uterine fibroid    History of traumatic brain injury    Seizures (HCC)    Left foot pain 11/11/2010    ONSET DATE: 2/24  REFERRING DIAG: RA of multiple joints   THERAPY DIAG:  Pain in right hand  Trigger thumb of right hand  Rationale for Evaluation and Treatment: Rehabilitation  SUBJECTIVE:   SUBJECTIVE STATEMENT: I did get in with Dr. Rosita Kea for a shot the 16th January.  The pain has been much better ,motion  better.  But there is still little click I think.  He said if the shot does not work surgery.  You helped me a lot  from where my thumb was - it came such a long way from where it was. 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  R thumb 1/10- tenderness over A1pulley  FALLS: Has patient fallen in last 6 months? Yes. 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  Has cats at home likes to read on her tablet, cleaning around the house on her phone about 2 hours a day.  PATIENT GOALS: Want the pain  in my hands better and  the stiffness - keep my motion and strength in my  hands to do the things I love and to be independent   NEXT MD VISIT: End of Oct  OBJECTIVE:  Note: Objective measures were completed at Evaluation unless otherwise noted.  HAND DOMINANCE: Right    UPPER EXTREMITY ROM:     Active ROM Right eval Left eval  Shoulder flexion    Shoulder abduction    Shoulder adduction    Shoulder extension    Shoulder internal rotation    Shoulder external rotation    Elbow flexion    Elbow extension    Wrist flexion 70 70  Wrist extension 80 80  Wrist ulnar deviation 30 30  Wrist radial deviation 20 1/10 pain  20  Wrist pronation    Wrist supination    (Blank rows = not tested)  Active ROM Right eval Left eval R 11/27/22 R 12/21/22 R 12/25/22 R 12/28/22 R 01/01/23 R 03/13/23  Thumb MCP (0-60)    45 PROM PROM 60 PROM 60 PROM 60 PROM WNL click  Thumb  IP (0-80) -20 ext - locking - pain   0 ext 45 PROM - pain free and trigger free  PROM 50  Trigger attempt to do AROM  PROM 60 , block AROM 30 no triggering PROM 60 and block AROM 45  WNL  Thumb Radial abd/add (0-55) 50 60 60 60  60  60  Thumb Palmar abd/add (0-45) 70 60 70 70  70  70  Thumb Opposition to Small Finger Opposition to base of 5th - pain        PROM to base of 5th  WNL to base of 5th- click at IP  Index MCP (0-90) 80  85        Index PIP (0-100) 95 95         Index DIP (0-70)            Long MCP (0-90) 80   90        Long PIP (0-100) 100  100        Long DIP (0-70)            Ring MCP (0-90) 80  90         Ring PIP (0-100) 100  100         Ring DIP (0-70)            Little MCP (0-90) 80  90         Little PIP (0-100)  100 95         Little DIP (0-70)            (Blank rows = not tested)   UPPER EXTREMITY MMT:  Strength in wrist in all planes - 4+/5 - no pain     HAND FUNCTION: EVAL : Grip strength: Right: 22 lbs; Left: 55 lbs, Lateral pinch: Right: 8 pain 5/10 lbs, Left: 11 lbs, and 3 point pinch: Right: 11 lbs, Left: 11 lbs  12/05/22: Grip strength: Right: 50 lbs pain ; Left: 55 lbs, Lateral pinch: Right: 8 lbs pain 3/10,  Left: 11 lbs, and 3 point pinch: Right: 8 lbs, Left: 11 lbs  03/13/23: Grip strength: Right: 55 lbs ; Left: 50 lbs, Lateral pinch: Right: 9 lbs Left: 9 lbs, and 3 point pinch: Right: 11 lbs, Left: 8 lbs  SENSATION: Pt report CT symptoms- numbness but splints helps at night time- but when gripping object , pinching can go numb   EDEMA:   R thumb and MC's at R  hand   COGNITION: Overall cognitive status: Within functional limits for tasks assessed       TODAY'S TREATMENT:                                                                                                                              DATE: 03/13/23   From start of care patient made fantastic progress in thumb palmar radial abduction as well as flexion and opposition and pain. When  patient was seen last time in December continued to have a trigger in the thumb with working on opposition and composite flexion. Recommended at that time for patient to get a shot. Patient returns today for follow-up with Dr. Rosita Kea to discharge on 03/01/2023. Patient coming this date with edema and inflammation. Tenderness over the A1 pulley from shot but less than a 1/10 Thumb palmar radial abduction within normal limits Composite flexion passive range of motion within normal limits As well as hyperextension of IP. But patient continues to to have a trigger with composite flexion, flexion of thumb IP as well as hyperextension of IP Recommend for patient to contact Dr. Rosita Kea for surgery.  Was recommended to shot does not work to have surgery. 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  PATIENT EDUCATION: Education details: findings of eval and HEP -joint protection and modifications Person educated: Patient Education method: Explanation, Demonstration, Tactile cues, Verbal cues, and Handouts Education comprehension: verbalized understanding, returned demonstration, verbal cues required, and needs further education    GOALS: Goals reviewed with patient? Yes  SHORT TERM GOALS: Target date: 2 wks  Pain and tenderness decreased in right thumb to less than a 5/10 patient able to maintain thumb IP extension and resting without splint. Baseline: Patient unable to maintain IP extension of right thumb.  locking at -20 degrees-tenderness over thumb A1 pulley 5-8/10 as well as with passive extension of thumb IP Goal status: MET    LONG TERM GOALS: Target date: 8 wk  Patient to be independent in home program to maintain digits active range of bilateral hands within normal limits and symptom-free including left thumb Baseline: Patient with increased stiffness and tightness over right metacarpals and decrease motion, left hand  stiffness and tightness with pain can increase at the most to 8/10 in the right hand and 5/10 in the left hand NOW had a shot but continued to have a trigger with composite flexion and composite extension including IP extension Goal status:Paritally  2.  Right thumb active range of motion increased to within functional limits and basic ADLs with triggering less than 1 time a day  baseline: Right thumb locks to -20 degrees extension at rest ;unable to maintain full extension.  Tenderness over the A1 pulley 5/10 NOW continue to show a trigger with composite flexion composite extension Goal status:Partially  3.  Right grip strength increased to more than 60% compared  to the left for patient to be able to grip utensils, brush and toothbrush without increase symptoms Baseline: Grip on the right dominant hand 22 pounds and left 55 pounds-patient report difficulty gripping objects Goal status: INITIAL  4.  Patient to verbalize 3 joint protection and modifications as well as adaptive equipment to decrease pain in bilateral hands as well as triggering. 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  Patient present at OT evaluation with  pain in bilateral hands with the right worse than the left.  5-8/10 pain with functional use - patient with severe pain in the right thumb as well as limited range of motion in thumb PA and RA, IP lock in flexion and unable to ext or flex thumb- pt did not want surgery at that time because of medical hx.  Patient was seen for 12 visits made great progress in palmar radial abduction as well as passive at range of motion composite flexion extension.  Gained IP  hyperextension again.  Last visit in December recommended for patient to get to orthopedics for possible shot.  Patient arrived this day with increased palmar radial abduction pain-free with passive range of motion composite flexion and extension including IP with great progress but continues to have a trigger.  Patient had a shot by Dr. Rosita Kea on 03/01/2023.  Did recommend surgery if shot did not work.  Recommend for patient to contact Dr. 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  Patient to contact me if needed after surgery. PERFORMANCE DEFICITS: in functional skills including ADLs, IADLs, coordination, dexterity, sensation, ROM, strength, pain, flexibility, Fine motor control, decreased knowledge of use of DME, and UE functional use, , and psychosocial skills including environmental adaptation and routines and behaviors.   IMPAIRMENTS: are limiting patient from ADLs, IADLs, rest and sleep, play, leisure, and social participation.   COMORBIDITIES: may have co-morbidities  that affects occupational performance. Patient will benefit from skilled OT to address above impairments and improve overall function.  MODIFICATION OR ASSISTANCE TO COMPLETE EVALUATION: Min-Moderate modification of tasks or assist with assess necessary to complete an evaluation.  OT OCCUPATIONAL PROFILE AND HISTORY: Detailed assessment: Review of records and additional review of physical, cognitive, psychosocial history related to current functional performance.  CLINICAL DECISION MAKING: Moderate - several treatment options, min-mod task modification necessary  REHAB POTENTIAL: Good for digits- fair for R trigger thumb  EVALUATION COMPLEXITY: Moderate      PLAN:  OT FREQUENCY1 visit   OT DURATION: 1 week  PLANNED INTERVENTIONS: self care/ADL training, therapeutic exercise, manual therapy, passive range  of motion, splinting, ultrasound, iontophoresis, paraffin, fluidotherapy, moist heat, cryotherapy, contrast bath, patient/family education, coping strategies training, and DME and/or AE instructions     CONSULTED AND AGREED WITH PLAN OF CARE: Patient     Oletta Cohn, OTR/L,CLT 03/13/2023, 1:41 PM

## 2023-04-26 ENCOUNTER — Telehealth

## 2023-06-18 ENCOUNTER — Ambulatory Visit: Payer: Self-pay | Admitting: Podiatry

## 2023-06-18 NOTE — Patient Instructions (Incomplete)
 Entered in error

## 2023-06-18 NOTE — Progress Notes (Signed)
 Pt checking to see if we received x-rays w/ referral. BUR confirmed that we did not and she will need to contact referring Doc to have them send to us .   She has been trying for 3 yrs to get the MyChart from [facility] (working w/ them) to merge so that we can see into those records.  I gave her our MyChart assistance # to see if there is some assistance they can provide. She is going to try that 1st before requesting Dr. Sharyon Deis to send x-rays.

## 2023-06-18 NOTE — Progress Notes (Unsigned)
 Entered in error

## 2023-06-20 ENCOUNTER — Encounter: Payer: Self-pay | Admitting: Podiatry

## 2023-06-20 ENCOUNTER — Ambulatory Visit: Payer: Self-pay | Admitting: Podiatry

## 2023-06-20 ENCOUNTER — Ambulatory Visit

## 2023-06-20 DIAGNOSIS — S92351A Displaced fracture of fifth metatarsal bone, right foot, initial encounter for closed fracture: Secondary | ICD-10-CM

## 2023-06-20 DIAGNOSIS — M7741 Metatarsalgia, right foot: Secondary | ICD-10-CM

## 2023-06-20 NOTE — Addendum Note (Signed)
 Addended byMichalene Agee, Myleigh Amara R on: 06/20/2023 04:42 PM   Modules accepted: Orders, Level of Service

## 2023-06-20 NOTE — Addendum Note (Signed)
 Addended by: Koreen Person on: 06/20/2023 02:49 PM   Modules accepted: Orders

## 2023-07-10 ENCOUNTER — Encounter: Payer: Self-pay | Admitting: Podiatry

## 2023-07-10 DIAGNOSIS — S92351A Displaced fracture of fifth metatarsal bone, right foot, initial encounter for closed fracture: Secondary | ICD-10-CM

## 2023-08-06 ENCOUNTER — Other Ambulatory Visit: Payer: Self-pay | Admitting: Physician Assistant

## 2023-08-06 DIAGNOSIS — Z1231 Encounter for screening mammogram for malignant neoplasm of breast: Secondary | ICD-10-CM

## 2023-08-08 ENCOUNTER — Ambulatory Visit (INDEPENDENT_AMBULATORY_CARE_PROVIDER_SITE_OTHER)

## 2023-08-08 ENCOUNTER — Encounter: Payer: Self-pay | Admitting: Podiatry

## 2023-08-08 ENCOUNTER — Ambulatory Visit (INDEPENDENT_AMBULATORY_CARE_PROVIDER_SITE_OTHER): Admitting: Podiatry

## 2023-08-08 DIAGNOSIS — S92351A Displaced fracture of fifth metatarsal bone, right foot, initial encounter for closed fracture: Secondary | ICD-10-CM | POA: Diagnosis not present

## 2023-08-08 NOTE — Progress Notes (Signed)
  Subjective:  Patient ID: Valerie Underwood, female    DOB: 06/25/1970,  MRN: [id number]  Chief Complaint  Patient presents with   Fracture    It seems to be a little better.  I have good and bad days.    53 y.o. female presents with the above complaint. History confirmed with patient.  She returns for follow-up notes improvement in pain and swelling  Objective:  Physical Exam: warm, good capillary refill, no trophic changes or ulcerative lesions, normal DP and PT pulses, normal sensory exam, and right foot no pain to palpation fifth metatarsal base there is some pain on the peroneals laterally in the ankle   Radiographs: Multiple views x-ray of the right foot: New films taken today shows about 75% consolidation of fracture site Assessment:   1. Closed metaphyseal fracture of fifth metatarsal bone of right foot      Plan:  Patient was evaluated and treated and all questions answered.  Significant improvement so far in her healing progression.  Bone marrow stimulation is effective for her and she may transition out of the cam walker boot to a lace up Tri-Lock style ankle brace today.  She is in physical therapy and we will add additional orders to begin working on range of motion and strengthening of the right lower extremity as well now that her fracture is healing.  Follow-up in 6 weeks for new x-rays.  Return in about 6 weeks (around 09/19/2023) for fracture follow up (new xrays R foot).

## 2023-09-13 ENCOUNTER — Ambulatory Visit
Admission: RE | Admit: 2023-09-13 | Discharge: 2023-09-13 | Disposition: A | Discharge status: Home / Self Care | Source: Ambulatory Visit | Attending: Physician Assistant | Admitting: Physician Assistant

## 2023-09-13 DIAGNOSIS — Z1231 Encounter for screening mammogram for malignant neoplasm of breast: Secondary | ICD-10-CM | POA: Diagnosis present

## 2023-09-19 ENCOUNTER — Ambulatory Visit: Admitting: Podiatry

## 2023-09-19 ENCOUNTER — Encounter: Payer: Self-pay | Admitting: Podiatry

## 2023-09-19 ENCOUNTER — Ambulatory Visit (INDEPENDENT_AMBULATORY_CARE_PROVIDER_SITE_OTHER)

## 2023-09-19 VITALS — Ht 68.0 in | Wt 310.0 lb

## 2023-09-19 DIAGNOSIS — M25371 Other instability, right ankle: Secondary | ICD-10-CM

## 2023-09-19 DIAGNOSIS — S92351A Displaced fracture of fifth metatarsal bone, right foot, initial encounter for closed fracture: Secondary | ICD-10-CM | POA: Diagnosis not present

## 2023-09-19 NOTE — Progress Notes (Signed)
  Subjective:  Patient ID: Valerie Underwood, female    DOB: October 20, 1970,  MRN: [id number]  Chief Complaint  Patient presents with   Fracture    Rm 4 Patient is here follow-up visit right foot fracture. Patient states pain after prolonged activity ( patient is walking 8-10,000 steps a day). Patient is using cold therapy and topical pain cream for relief,    53 y.o. female presents with the above complaint. History confirmed with patient.    Objective:  Physical Exam: warm, good capillary refill, no trophic changes or ulcerative lesions, normal DP and PT pulses, normal sensory exam, and right foot no pain to palpation fifth metatarsal base, she has no pain in the peroneal tendons today no pain over the ATFL or CFL no gross instability to examination  Radiographs: Multiple views x-ray of the right foot: New films taken today shows about 90%  consolidation of fracture site Assessment:   1. Closed metaphyseal fracture of fifth metatarsal bone of right foot      Plan:  Patient was evaluated and treated and all questions answered.  Continues to progress.  Continue using bone stimulator and may transition away from the Tri-Lock ankle brace to regular shoe gear without the brace.  Continue physical therapy and can focus on ankle stabilization as well.  Return in 2 months for new radiographs expect should be close to 100% by that point.  Long-term if not improving would recommend MRI to reevaluate ankle instability if this continues to be a problem for her.  No restrictions on physical therapy at this point.  Return in about 2 months (around 11/19/2023) for fracture follow up (new xrays).

## 2023-11-12 ENCOUNTER — Telehealth: Payer: Self-pay

## 2023-11-12 NOTE — Telephone Encounter (Signed)
 Pt contacted office to schedule colonoscopy.  Informed her based on previous colonoscopy performed on 09/16/19 she had a precancerous polyp, however Dr. Jinny recommended repeat in 7 years as opposed to 2-3 years.  She thought she was supposed to repeat in 2 years.  She would like to know if she could have her colonoscopy sooner than 7 years since the colon polyp was precancerous.  She was adopted-unsure if there is a family history of colon cancer.  Please advise on pts request to have colonoscopy sooner than 7 years.  Thanks,  Lad[place]mith, CMA

## 2023-11-21 ENCOUNTER — Ambulatory Visit (INDEPENDENT_AMBULATORY_CARE_PROVIDER_SITE_OTHER)

## 2023-11-21 ENCOUNTER — Ambulatory Visit (INDEPENDENT_AMBULATORY_CARE_PROVIDER_SITE_OTHER): Admitting: Podiatry

## 2023-11-21 VITALS — Ht 68.0 in | Wt 310.0 lb

## 2023-11-21 DIAGNOSIS — S92351A Displaced fracture of fifth metatarsal bone, right foot, initial encounter for closed fracture: Secondary | ICD-10-CM | POA: Diagnosis not present

## 2023-11-21 NOTE — Progress Notes (Signed)
  Subjective:  Patient ID: Greig JINNY Squibb, female    DOB: February 13, 1971,  MRN: [id number]  No chief complaint on file.   53 y.o. female presents with the above complaint. History confirmed with patient.    Objective:  Physical Exam: warm, good capillary refill, no trophic changes or ulcerative lesions, normal DP and PT pulses, normal sensory exam, and right foot no pain to palpation fifth metatarsal base, she has no pain in the peroneal tendons today no pain over the ATFL or CFL no gross instability to examination  Radiographs: Multiple views x-ray of the right foot: New films taken today shows increased consolidation and near complete healing of fracture send distal plantar lateral cortex to remain Assessment:   1. Closed metaphyseal fracture of fifth metatarsal bone of right foot      Plan:  Patient was evaluated and treated and all questions answered.  She is doing well she is in regular shoe gear doing 15,000 steps a day.  Discussed with the bone will continue strengthening over the next 6 months.  Should be able to follow-up as needed with me if it worsens at any point or if it does not improve. No follow-ups on file.

## 2023-12-02 ENCOUNTER — Ambulatory Visit

## 2024-03-24 ENCOUNTER — Inpatient Hospital Stay: Admission: RE | Admit: 2024-03-24

## 2024-04-07 ENCOUNTER — Ambulatory Visit: Admit: 2024-04-07 | Admitting: Obstetrics and Gynecology

## 2024-04-07 SURGERY — DILATATION AND CURETTAGE /HYSTEROSCOPY
Anesthesia: Choice | Site: Uterus
